# Patient Record
Sex: Male | Born: 1960 | Race: White | Hispanic: No | Marital: Married | Smoking: Former smoker
Health system: Southern US, Community
[De-identification: ages and names within clinical notes are randomized; demographics above are authoritative.]

## PROBLEM LIST (undated history)

## (undated) DIAGNOSIS — I10 Essential (primary) hypertension: Secondary | ICD-10-CM

## (undated) DIAGNOSIS — E785 Hyperlipidemia, unspecified: Secondary | ICD-10-CM

## (undated) DIAGNOSIS — I2510 Atherosclerotic heart disease of native coronary artery without angina pectoris: Secondary | ICD-10-CM

## (undated) HISTORY — PX: CARDIAC CATHETERIZATION: SHX172

## (undated) HISTORY — PX: CORONARY ANGIOPLASTY: SHX604

## (undated) HISTORY — DX: Hyperlipidemia, unspecified: E78.5

## (undated) HISTORY — DX: Atherosclerotic heart disease of native coronary artery without angina pectoris: I25.10

---

## 2003-10-31 ENCOUNTER — Ambulatory Visit (HOSPITAL_COMMUNITY): Admission: RE | Admit: 2003-10-31 | Discharge: 2003-10-31 | Payer: Self-pay | Admitting: Family Medicine

## 2006-09-20 ENCOUNTER — Ambulatory Visit (HOSPITAL_COMMUNITY): Admission: RE | Admit: 2006-09-20 | Discharge: 2006-09-20 | Payer: Self-pay | Admitting: Family Medicine

## 2006-09-28 ENCOUNTER — Encounter: Admission: RE | Admit: 2006-09-28 | Discharge: 2006-09-28 | Payer: Self-pay | Admitting: *Deleted

## 2006-10-21 ENCOUNTER — Ambulatory Visit (HOSPITAL_COMMUNITY): Admission: RE | Admit: 2006-10-21 | Discharge: 2006-10-21 | Payer: Self-pay | Admitting: Gastroenterology

## 2008-04-28 IMAGING — RF DG BE W/ CM - WO/W KUB
11 series · 11 of 11 positions shown · non-contrast
Comparison: none

CLINICAL DATA: Change in bowel habits.  
 BARIUM ENEMA:
 KUB:  Unremarkable.
 A full column study was done with palpation.  There are no filling defects to suggest significant polyps.  No mucosal lesions are identified.  No diverticula are noted.  I was unable to reflux barium into the terminal ileum while filling the colon.  On the post-evacuation images, there is some reflux into the distal small bowel.

[Series 1: run · 1 of 1 slices shown (1 of 11)]
[im 1/1]
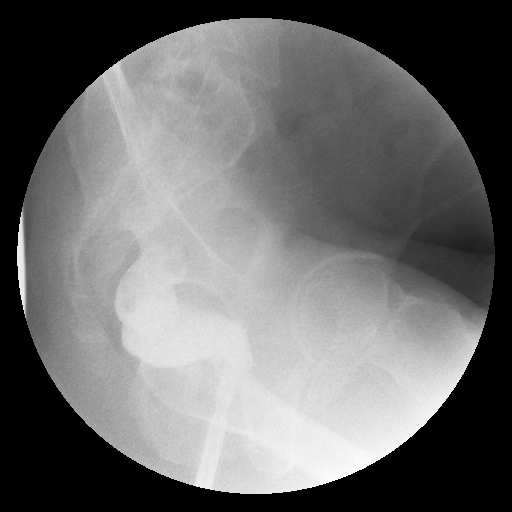

[Series 2: run · 1 of 1 slices shown (2 of 11)]
[im 1/1]
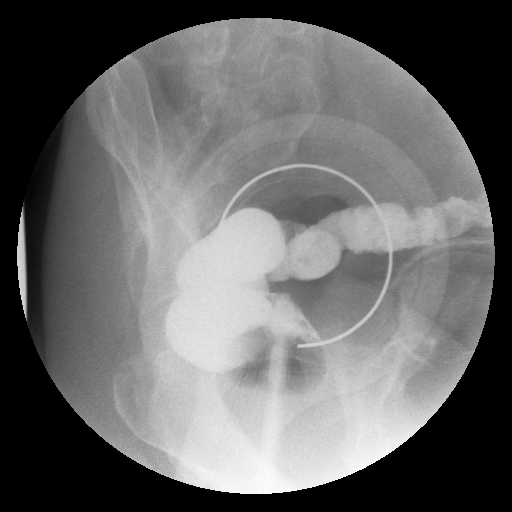

[Series 3: run · 1 of 1 slices shown (3 of 11)]
[im 1/1]
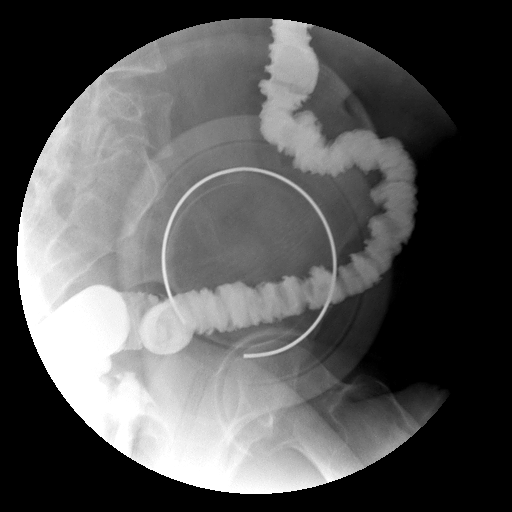

[Series 4: run · 1 of 1 slices shown (4 of 11)]
[im 1/1]
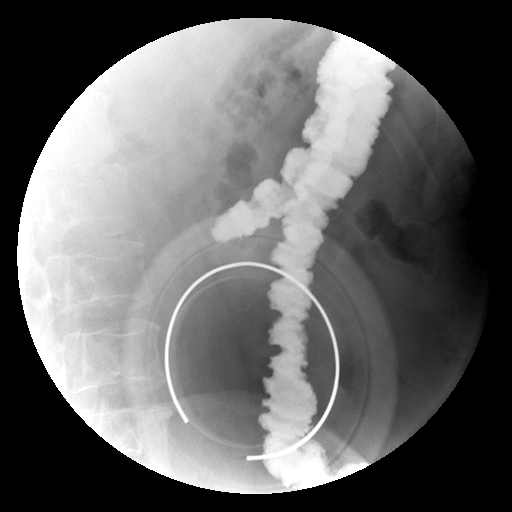

[Series 5: run · 1 of 1 slices shown (5 of 11)]
[im 1/1]
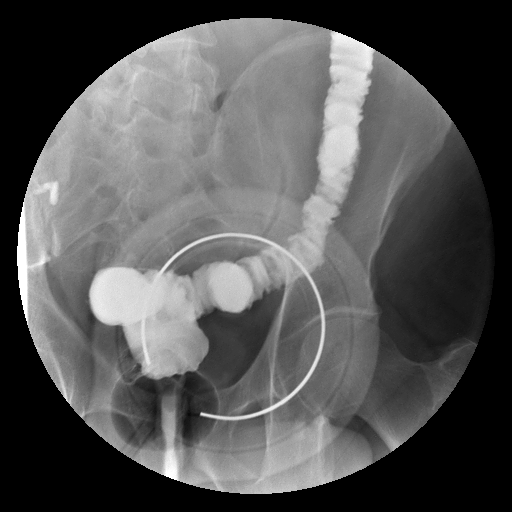

[Series 6: run · 1 of 1 slices shown (6 of 11)]
[im 1/1]
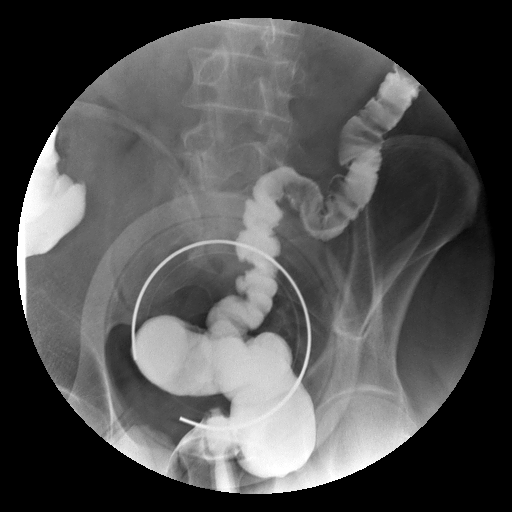

[Series 7: run · 1 of 1 slices shown (7 of 11)]
[im 1/1]
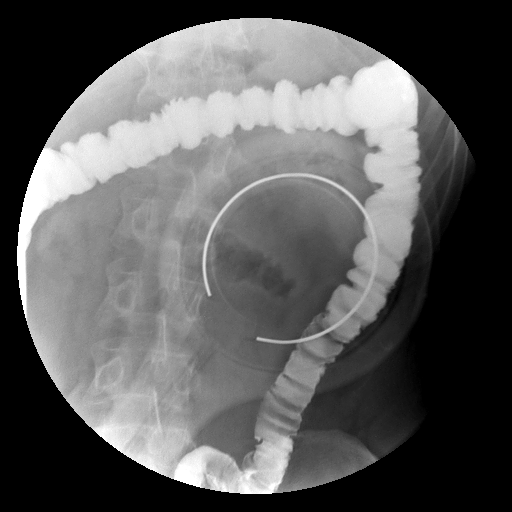

[Series 8: run · 1 of 1 slices shown (8 of 11)]
[im 1/1]
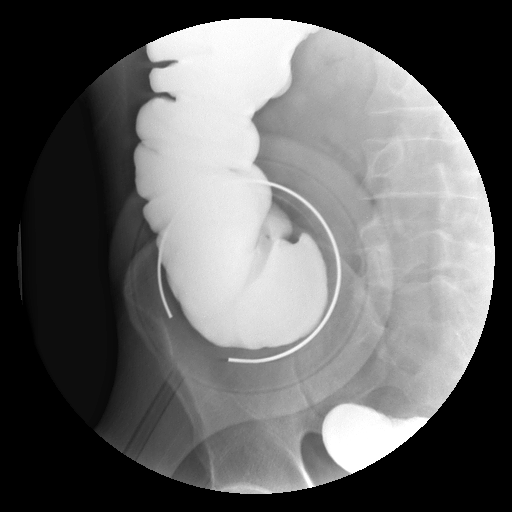

[Series 9: run · 1 of 1 slices shown (9 of 11)]
[im 1/1]
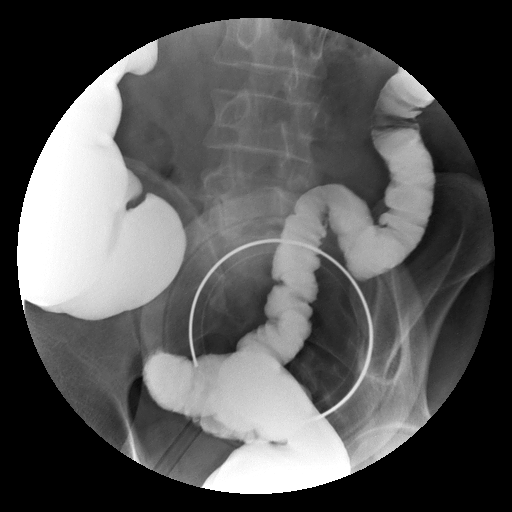

[Series 10: run · 1 of 1 slices shown (10 of 11)]
[im 1/1]
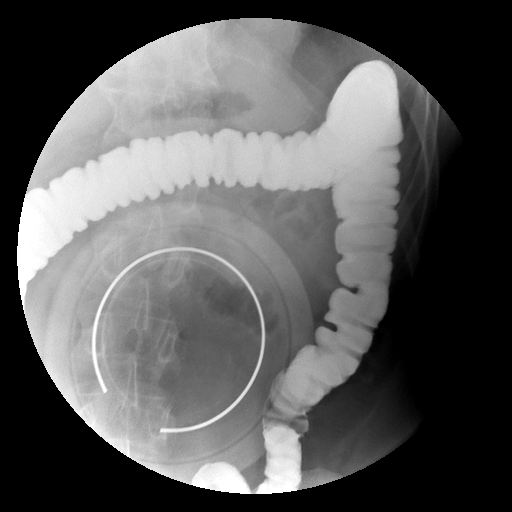

[Series 11: run · 1 of 1 slices shown (11 of 11)]
[im 1/1]
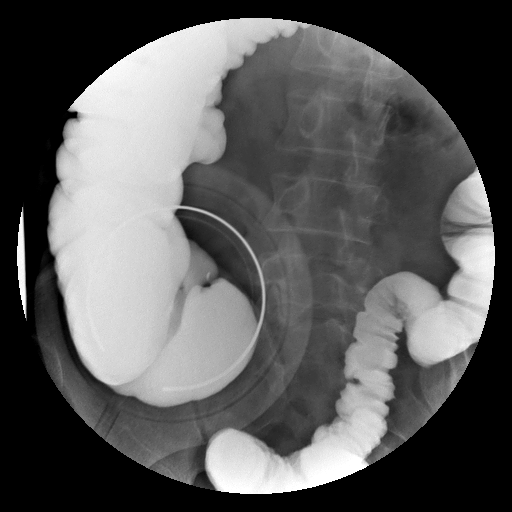

[11 of 11 positions shown; findings below may reference images not displayed]

IMPRESSION: No pathological findings.

## 2014-12-26 ENCOUNTER — Ambulatory Visit (INDEPENDENT_AMBULATORY_CARE_PROVIDER_SITE_OTHER): Payer: Worker's Compensation | Admitting: Family Medicine

## 2014-12-26 ENCOUNTER — Ambulatory Visit: Payer: Worker's Compensation

## 2014-12-26 VITALS — BP 172/90 | HR 75 | Temp 98.6°F | Resp 14 | Ht 70.0 in | Wt 238.6 lb

## 2014-12-26 DIAGNOSIS — S61239A Puncture wound without foreign body of unspecified finger without damage to nail, initial encounter: Secondary | ICD-10-CM | POA: Diagnosis not present

## 2014-12-26 DIAGNOSIS — S6992XA Unspecified injury of left wrist, hand and finger(s), initial encounter: Secondary | ICD-10-CM | POA: Diagnosis not present

## 2014-12-26 DIAGNOSIS — Z23 Encounter for immunization: Secondary | ICD-10-CM

## 2014-12-26 MED ORDER — DOXYCYCLINE HYCLATE 100 MG PO CAPS: 100.0000 mg | ORAL_CAPSULE | Freq: Two times a day (BID) | ORAL | Status: DC

## 2014-12-26 NOTE — Progress Notes (Signed)
    MRN: [id number] DOB: 07-25-1960  Subjective:   Erik Armstrong is a 54 y.o. male presenting for chief complaint of Hand Pain  Reports nail gun injury sustained to his left thumb while at work today, 12/26/2014. Reports nail was ~3" in length and per patient did not penetrate to opposite side, per patient now went in about a quarter inch and he pulled it out himself. Patient felt immediate pain and had subsequent bleeding and swelling. Has not tried any medications for relief. Denies decreased range of motion, decreased sensation, active bleeding, drainage, bony deformity. Also denies headache, double vision, dizziness, chest pain, shortness of breath, heart racing, palpitations, jaw pain, neck pain, left arm pain, nausea, vomiting, abdominal pain. Denies any other aggravating or relieving factors, no other questions or concerns.  Erik Armstrong currently has no medications in their medication list. He has No Known Allergies.  ROS As in subjective.  Objective:   Vitals: BP 172/90 mmHg  Pulse 75  Temp(Src) 98.6 F (37 C) (Oral)  Resp 14  Ht 5\' 10"  (1.778 m)  Wt 238 lb 9.6 oz (108.228 kg)  BMI 34.24 kg/m2  SpO2 98%  Physical Exam  Constitutional: He is oriented to person, place, and time. He appears well-developed and well-nourished.  Cardiovascular: Normal rate, regular rhythm and intact distal pulses.  Exam reveals no gallop and no friction rub.   No murmur heard. Pulmonary/Chest: No respiratory distress. He has no wheezes. He has no rales.  Musculoskeletal:       Left hand: He exhibits tenderness (near wound) and swelling. He exhibits normal range of motion, no bony tenderness, normal capillary refill, no deformity and no laceration. Normal sensation noted. Normal strength noted.       Hands: Neurological: He is alert and oriented to person, place, and time.  Skin: Skin is warm and dry. No rash noted. No erythema. No pallor.   [facility] reading (PRIMARY) by  Dr. Katrinka Blazing and PA-Keelie Zemanek. Left  thumb: possible puncture wound to base of proximal phalanx seen on all 3 views, please comment.  Assessment and Plan :   1. Thumb injury, left, initial encounter 2. Need for Tdap vaccination 3. Puncture wound of finger, initial encounter - Stable, clean wound and irrigated with 30 cc of sterile saline, patient is to return to clinic for recheck in 5 days. In the meantime, start doxycycline for 10 days and use ibuprofen or Tylenol for pain and inflammation. Were restrictions provided. - Counseled patient on worsening signs and symptoms of infection, instructions provided in AVS before 5 days if these develop. Patient agreed.  Wallis Bamberg, PA-C [facility] [phone] 12/26/2014 1:25 PM

## 2014-12-26 NOTE — Patient Instructions (Signed)
Puncture Wound °A puncture wound is an injury that extends through all layers of the skin and into the tissue beneath the skin (subcutaneous tissue). Puncture wounds become infected easily because germs often enter the body and go beneath the skin during the injury. Having a deep wound with a small entrance point makes it difficult for your caregiver to adequately clean the wound. This is especially true if you have stepped on a nail and it has passed through a dirty shoe or other situations where the wound is obviously contaminated. °CAUSES  °Many puncture wounds involve glass, nails, splinters, fish hooks, or other objects that enter the skin (foreign bodies). A puncture wound may also be caused by a human bite or animal bite. °DIAGNOSIS  °A puncture wound is usually diagnosed by your history and a physical exam. You may need to have an X-ray or an ultrasound to check for any foreign bodies still in the wound. °TREATMENT  °· Your caregiver will clean the wound as thoroughly as possible. Depending on the location of the wound, a bandage (dressing) may be applied. °· Your caregiver might prescribe antibiotic medicines. °· You may need a follow-up visit to check on your wound. Follow all instructions as directed by your caregiver. °HOME CARE INSTRUCTIONS  °· Change your dressing once per day, or as directed by your caregiver. If the dressing sticks, it may be removed by soaking the area in water. °· If your caregiver has given you follow-up instructions, it is very important that you return for a follow-up appointment. Not following up as directed could result in a chronic or permanent injury, pain, and disability. °· Only take over-the-counter or prescription medicines for pain, discomfort, or fever as directed by your caregiver. °· If you are given antibiotics, take them as directed. Finish them even if you start to feel better. °You may need a tetanus shot if: °· You cannot remember when you had your last tetanus  shot. °· You have never had a tetanus shot. °If you got a tetanus shot, your arm may swell, get red, and feel warm to the touch. This is common and not a problem. If you need a tetanus shot and you choose not to have one, there is a rare chance of getting tetanus. Sickness from tetanus can be serious. °You may need a rabies shot if an animal bite caused your puncture wound. °SEEK MEDICAL CARE IF:  °· You have redness, swelling, or increasing pain in the wound. °· You have red streaks going away from the wound. °· You notice a bad smell coming from the wound or dressing. °· You have yellowish-white fluid (pus) coming from the wound. °· You are treated with an antibiotic for infection, but the infection is not getting better. °· You notice something in the wound, such as rubber from your shoe, cloth, or another object. °· You have a fever. °· You have severe pain. °· You have difficulty breathing. °· You feel dizzy or faint. °· You cannot stop vomiting. °· You lose feeling, develop numbness, or cannot move a limb below the wound. °· Your symptoms worsen. °MAKE SURE YOU: °· Understand these instructions. °· Will watch your condition. °· Will get help right away if you are not doing well or get worse. °Document Released: 04/08/2005 Document Revised: 09/21/2011 Document Reviewed: 12/16/2010 °ExitCare® Patient Information ©2015 [employer]. This information is not intended to replace advice given to you by your health care provider. Make sure you discuss any questions you   have with your health care provider. ° °

## 2014-12-27 NOTE — Progress Notes (Signed)
History and physical examinations reviewed in detail with PA [facility].  Xray reviewed during visit.  Agree with assessment and plan. Guerin Lashomb Paulita Fujita, M.D. [facility] [address] [place], [place]  [address] [phone] phone [phone] fax

## 2014-12-31 ENCOUNTER — Ambulatory Visit (INDEPENDENT_AMBULATORY_CARE_PROVIDER_SITE_OTHER): Payer: Worker's Compensation | Admitting: Internal Medicine

## 2014-12-31 VITALS — BP 180/100 | HR 99 | Temp 98.1°F | Resp 16 | Ht 70.0 in | Wt 236.0 lb

## 2014-12-31 DIAGNOSIS — S61239D Puncture wound without foreign body of unspecified finger without damage to nail, subsequent encounter: Secondary | ICD-10-CM | POA: Diagnosis not present

## 2014-12-31 DIAGNOSIS — S6992XA Unspecified injury of left wrist, hand and finger(s), initial encounter: Secondary | ICD-10-CM

## 2014-12-31 NOTE — Progress Notes (Signed)
    MRN: [id number] DOB: 09-18-1960  Subjective:   Erik Armstrong is a 54 y.o. male presenting for follow up on workers comp injury. Patient sustained no puncture wound to his left on 12/26/2014. He was seen here at [facility] and started on doxycycline. Today, he reports residual mild and intermittent tenderness over his thumb. The swelling has improved. Denies fevers, decreased range of motion, decreased strength, numbness, tingling, fevers, redness, drainage of pus or bleeding. Denies any other aggravating or relieving factors, no other questions or concerns.  Erik Armstrong has a current medication list which includes the following prescription(s): doxycycline. He has No Known Allergies.  ROS As in subjective.  Objective:   Vitals: BP 180/100 mmHg  Pulse 99  Temp(Src) 98.1 F (36.7 C) (Oral)  Resp 16  Ht 5\' 10"  (1.778 m)  Wt 236 lb (107.049 kg)  BMI 33.86 kg/m2  SpO2 99%  Physical Exam  Constitutional: He is oriented to person, place, and time. He appears well-developed and well-nourished.  Cardiovascular: Normal rate.   Pulmonary/Chest: Effort normal.  Musculoskeletal:       Left hand: He exhibits swelling (trace edema). He exhibits normal range of motion, no tenderness, no bony tenderness, normal capillary refill, no deformity and no laceration. Normal sensation noted. Normal strength (5/5) noted.  Neurological: He is alert and oriented to person, place, and time.  Skin: Skin is warm and dry. No rash noted. No erythema. No pallor.   Assessment and Plan :   1. Thumb injury, left, initial encounter 2. Puncture wound of finger, subsequent encounter - Stable, healing well. Anticipatory guidance provided, recommended she use ibuprofen or naproxen for pain and inflammation. Also recommended that he ice his hand after work. Patient should followup only if he does not achieve complete recovery in 1 to 2 weeks.  Wallis Bamberg, PA-C [facility] [phone] 12/31/2014 8:17 AM

## 2014-12-31 NOTE — Patient Instructions (Signed)
You may use 400-600 mg of ibuprofen with food every 6-8 hours. Please ice your hand when you come home from work, 10 minutes every 2 hours.  Puncture Wound A puncture wound is an injury that extends through all layers of the skin and into the tissue beneath the skin (subcutaneous tissue). Puncture wounds become infected easily because germs often enter the body and go beneath the skin during the injury. Having a deep wound with a small entrance point makes it difficult for your caregiver to adequately clean the wound. This is especially true if you have stepped on a nail and it has passed through a dirty shoe or other situations where the wound is obviously contaminated. CAUSES  Many puncture wounds involve glass, nails, splinters, fish hooks, or other objects that enter the skin (foreign bodies). A puncture wound may also be caused by a human bite or animal bite. DIAGNOSIS  A puncture wound is usually diagnosed by your history and a physical exam. You may need to have an X-ray or an ultrasound to check for any foreign bodies still in the wound. TREATMENT   Your caregiver will clean the wound as thoroughly as possible. Depending on the location of the wound, a bandage (dressing) may be applied.  Your caregiver might prescribe antibiotic medicines.  You may need a follow-up visit to check on your wound. Follow all instructions as directed by your caregiver. HOME CARE INSTRUCTIONS   Change your dressing once per day, or as directed by your caregiver. If the dressing sticks, it may be removed by soaking the area in water.  If your caregiver has given you follow-up instructions, it is very important that you return for a follow-up appointment. Not following up as directed could result in a chronic or permanent injury, pain, and disability.  Only take over-the-counter or prescription medicines for pain, discomfort, or fever as directed by your caregiver.  If you are given antibiotics, take them as  directed. Finish them even if you start to feel better. You may need a tetanus shot if:  You cannot remember when you had your last tetanus shot.  You have never had a tetanus shot. If you got a tetanus shot, your arm may swell, get red, and feel warm to the touch. This is common and not a problem. If you need a tetanus shot and you choose not to have one, there is a rare chance of getting tetanus. Sickness from tetanus can be serious. You may need a rabies shot if an animal bite caused your puncture wound. SEEK MEDICAL CARE IF:   You have redness, swelling, or increasing pain in the wound.  You have red streaks going away from the wound.  You notice a bad smell coming from the wound or dressing.  You have yellowish-white fluid (pus) coming from the wound.  You are treated with an antibiotic for infection, but the infection is not getting better.  You notice something in the wound, such as rubber from your shoe, cloth, or another object.  You have a fever.  You have severe pain.  You have difficulty breathing.  You feel dizzy or faint.  You cannot stop vomiting.  You lose feeling, develop numbness, or cannot move a limb below the wound.  Your symptoms worsen. MAKE SURE YOU:  Understand these instructions.  Will watch your condition.  Will get help right away if you are not doing well or get worse. Document Released: 04/08/2005 Document Revised: 09/21/2011 Document Reviewed: 12/16/2010 ExitCare  Patient Information 2015 [employer]. This information is not intended to replace advice given to you by your health care provider. Make sure you discuss any questions you have with your health care provider.  

## 2016-07-03 IMAGING — CR DG FINGER THUMB 2+V*L*
1 series · 1 of 1 positions shown · non-contrast
Comparison: None.

CLINICAL DATA: Thumb injury using nail gun, puncture wound at the
base of proximal phalanx, initial encounter

EXAM:
LEFT THUMB 2+V

[PA]
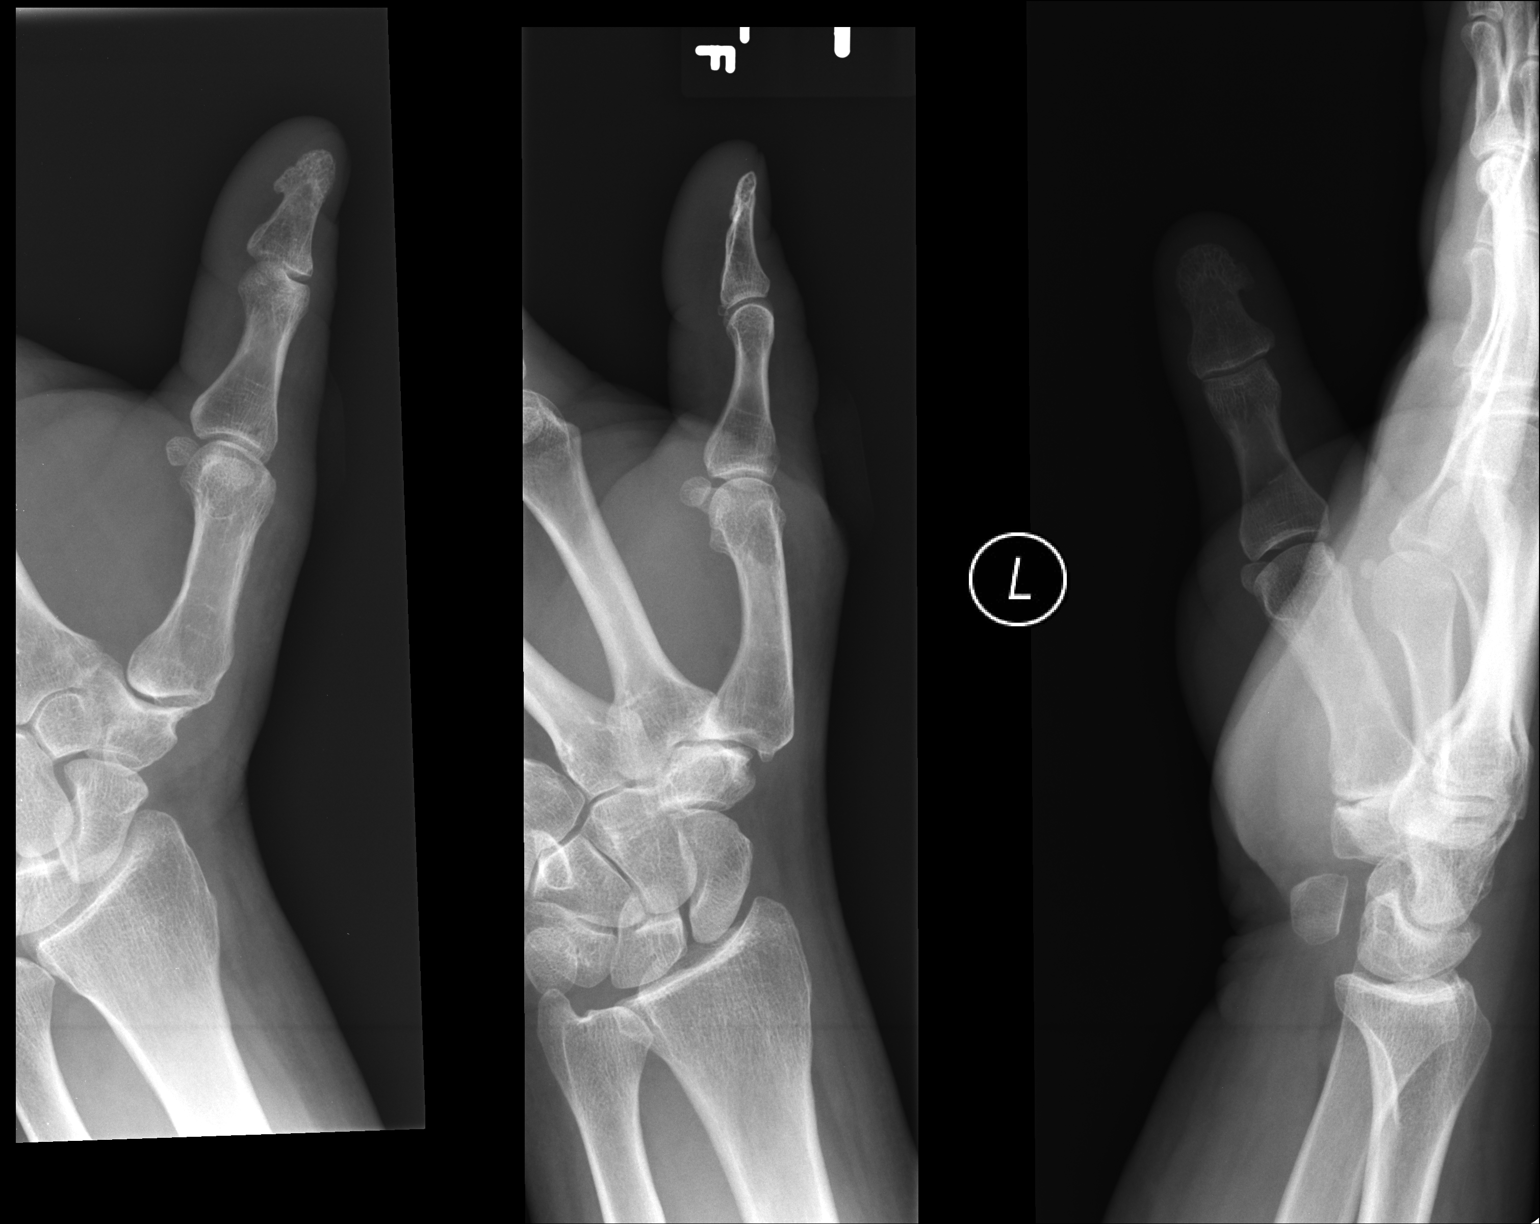

[1 of 1 positions shown; findings below may reference images not displayed]

FINDINGS: Three views of the left thumb submitted. No displaced fracture or
subluxation. There is focal cortical irregularity dorsal aspect at
the base of proximal phalanx. Although no avulsed bony fragment is
noted focal bone contusion or bruising cannot be excluded given the
mechanism of injury. Clinical correlation is necessary. Further
correlation with MRI could be performed as clinically warranted.
IMPRESSION: No displaced fracture or subluxation. There is focal cortical
irregularity dorsal aspect at the base of proximal phalanx. Although
no avulsed bony fragment is noted focal bone contusion or bruising
cannot be excluded given the mechanism of injury. Clinical
correlation is necessary. Further correlation with MRI could be
performed as clinically warranted.

## 2017-07-30 ENCOUNTER — Emergency Department (HOSPITAL_COMMUNITY): Payer: Worker's Compensation

## 2017-07-30 ENCOUNTER — Encounter (HOSPITAL_COMMUNITY): Payer: Self-pay

## 2017-07-30 ENCOUNTER — Other Ambulatory Visit: Payer: Self-pay

## 2017-07-30 ENCOUNTER — Emergency Department (HOSPITAL_COMMUNITY)
Admission: EM | Admit: 2017-07-30 | Discharge: 2017-07-31 | Disposition: A | Discharge status: Home / Self Care | Payer: Worker's Compensation | Attending: Emergency Medicine | Admitting: Emergency Medicine

## 2017-07-30 DIAGNOSIS — F17200 Nicotine dependence, unspecified, uncomplicated: Secondary | ICD-10-CM | POA: Diagnosis not present

## 2017-07-30 DIAGNOSIS — N5082 Scrotal pain: Secondary | ICD-10-CM | POA: Diagnosis present

## 2017-07-30 DIAGNOSIS — N451 Epididymitis: Secondary | ICD-10-CM | POA: Insufficient documentation

## 2017-07-30 DIAGNOSIS — N5089 Other specified disorders of the male genital organs: Secondary | ICD-10-CM

## 2017-07-30 DIAGNOSIS — Z79899 Other long term (current) drug therapy: Secondary | ICD-10-CM | POA: Diagnosis not present

## 2017-07-30 DIAGNOSIS — I10 Essential (primary) hypertension: Secondary | ICD-10-CM | POA: Diagnosis not present

## 2017-07-30 HISTORY — DX: Essential (primary) hypertension: I10

## 2017-07-30 LAB — URINALYSIS, ROUTINE W REFLEX MICROSCOPIC
BILIRUBIN URINE: NEGATIVE
Bacteria, UA: NONE SEEN
Glucose, UA: NEGATIVE mg/dL
Ketones, ur: NEGATIVE mg/dL
LEUKOCYTES UA: NEGATIVE
NITRITE: NEGATIVE
Protein, ur: NEGATIVE mg/dL
SPECIFIC GRAVITY, URINE: 1.016 (ref 1.005–1.030)
pH: 5 (ref 5.0–8.0)

## 2017-07-30 NOTE — ED Provider Notes (Signed)
[facility] EMERGENCY DEPARTMENT Provider Note   CSN: [id number] Arrival date & time: 07/30/17  1910     History   Chief Complaint Chief Complaint  Patient presents with  . Groin Swelling    HPI Erik Armstrong is a 57 y.o. male who presents for evaluation of 1 week of left scrotal pain and swelling.  Patient reports his symptoms have worsened over the last week.  He was seen by urgent care and was told to go to the emergency department for further evaluation to rule out hernia.  Patient reports that over the course of the week, the left testicle has swollen significantly.  He reports that the left testicle is painful to touch.  He denies any warmth or redness.  Patient denies any fevers, night sweats, weight loss, difficulty urinating, dysuria, hematuria, penile pain, penile discharge.  The history is provided by the patient.    Past Medical History:  Diagnosis Date  . Hypertension     There are no active problems to display for this patient.   History reviewed. No pertinent surgical history.     Home Medications    Prior to Admission medications   Medication Sig Start Date End Date Taking? Authorizing Provider  fluticasone (FLONASE) 50 MCG/ACT nasal spray Place 2 sprays into both nostrils daily as needed for allergies or rhinitis.   Yes [provider]  loratadine (CLARITIN) 10 MG tablet Take 10 mg by mouth daily as needed for allergies or rhinitis.   Yes [provider]  naproxen sodium (ALEVE) 220 MG tablet Take 220 mg by mouth every morning.   Yes [provider]  ciprofloxacin (CIPRO) 500 MG tablet Take 1 tablet (500 mg total) by mouth every 12 (twelve) hours. 07/31/17   Maxwell Caul, PA-C  doxycycline (VIBRAMYCIN) 100 MG capsule Take 1 capsule (100 mg total) by mouth 2 (two) times daily. Patient not taking: Reported on 07/30/2017 12/26/14   Wallis Bamberg, PA-C  ibuprofen (ADVIL,MOTRIN) 600 MG tablet Take 1 tablet (600 mg  total) by mouth every 6 (six) hours as needed. 07/31/17   Maxwell Caul, PA-C    Family History History reviewed. No pertinent family history.  Social History Social History   Tobacco Use  . Smoking status: Current Every Day Smoker  Substance Use Topics  . Alcohol use: No    Alcohol/week: 0.0 oz    Frequency: Never  . Drug use: No     Allergies   Patient has no known allergies.   Review of Systems Review of Systems  Constitutional: Negative for diaphoresis, fever and unexpected weight change.  Cardiovascular: Negative for chest pain.  Gastrointestinal: Negative for abdominal pain, nausea and vomiting.  Genitourinary: Positive for scrotal swelling and testicular pain. Negative for discharge, dysuria, hematuria, penile pain and penile swelling.     Physical Exam Updated Vital Signs BP (!) 144/85 (BP Location: Right Arm)   Pulse 93   Temp 98 F (36.7 C) (Oral)   Resp 17   SpO2 98%   Physical Exam  Constitutional: He appears well-developed and well-nourished.  HENT:  Head: Normocephalic and atraumatic.  Eyes: Conjunctivae and EOM are normal. Right eye exhibits no discharge. Left eye exhibits no discharge. No scleral icterus.  Pulmonary/Chest: Effort normal.  Abdominal: Hernia confirmed negative in the right inguinal area.  Genitourinary: Penis normal. Right testis shows no swelling and no tenderness. Left testis shows swelling and tenderness. Circumcised.  Genitourinary Comments: The exam was performed with a  chaperone present. Normal male genitalia. No evidence of rash, ulcers or lesions.  There is noted swelling and tenderness of the left testicle/scrotum.  No overlying warmth or erythema.  Questionable hernia or mass noted on the left side.  There is a small area that is palpable but is not able to be reduced.  Neurological: He is alert.  Skin: Skin is warm and dry.  Psychiatric: He has a normal mood and affect. His speech is normal and behavior is normal.    Nursing note and vitals reviewed.    ED Treatments / Results  Labs (all labs ordered are listed, but only abnormal results are displayed) Labs Reviewed  URINALYSIS, ROUTINE W REFLEX MICROSCOPIC - Abnormal; Notable for the following components:      Result Value   Hgb urine dipstick SMALL (*)    Squamous Epithelial / LPF 0-5 (*)    All other components within normal limits  URINE CULTURE    EKG  EKG Interpretation None       Radiology [place] Scrotum  Result Date: 07/30/2017 CLINICAL DATA:  Groin pain EXAM: SCROTAL ULTRASOUND DOPPLER ULTRASOUND OF THE TESTICLES TECHNIQUE: Complete ultrasound examination of the testicles, epididymis, and other scrotal structures was performed. Color and spectral Doppler ultrasound were also utilized to evaluate blood flow to the testicles. COMPARISON:  None. FINDINGS: Right testicle Measurements: 4.8 x 2.4 x 3.7 cm. No mass or microlithiasis visualized. Left testicle Measurements: 4.2 x 2.4 x 3.3 cm. Hypoechoic masslike area measuring 2 x 1.9 x 2.1 cm without significant internal vascularity. Right epididymis:  Multiple cysts. Left epididymis: Slightly echogenic and enlarged. Mild increased vascularity. Hydrocele:  Small left hydrocele containing particulate foci Varicocele:  Small left varicocele Pulsed Doppler interrogation of both testes demonstrates normal low resistance arterial and venous waveforms bilaterally. IMPRESSION: 1. Hypoechoic masslike area in the left testis measuring 2.1 cm without significant internal flow, differential includes segmental testicular infarct and hypovascular solid testicular mass. 2. Slightly echogenic and vascular left epididymis, query epididymitis 3. Small left hydrocele 4. Small left varicocele Electronically Signed   By: Jasmine PangKim  Fujinaga M.D.   On: 07/30/2017 23:51   [place] Art/ven Flow Abd Pelv Doppler  Result Date: 07/30/2017 CLINICAL DATA:  Groin pain EXAM: SCROTAL ULTRASOUND DOPPLER ULTRASOUND OF THE TESTICLES TECHNIQUE:  Complete ultrasound examination of the testicles, epididymis, and other scrotal structures was performed. Color and spectral Doppler ultrasound were also utilized to evaluate blood flow to the testicles. COMPARISON:  None. FINDINGS: Right testicle Measurements: 4.8 x 2.4 x 3.7 cm. No mass or microlithiasis visualized. Left testicle Measurements: 4.2 x 2.4 x 3.3 cm. Hypoechoic masslike area measuring 2 x 1.9 x 2.1 cm without significant internal vascularity. Right epididymis:  Multiple cysts. Left epididymis: Slightly echogenic and enlarged. Mild increased vascularity. Hydrocele:  Small left hydrocele containing particulate foci Varicocele:  Small left varicocele Pulsed Doppler interrogation of both testes demonstrates normal low resistance arterial and venous waveforms bilaterally. IMPRESSION: 1. Hypoechoic masslike area in the left testis measuring 2.1 cm without significant internal flow, differential includes segmental testicular infarct and hypovascular solid testicular mass. 2. Slightly echogenic and vascular left epididymis, query epididymitis 3. Small left hydrocele 4. Small left varicocele Electronically Signed   By: Jasmine PangKim  Fujinaga M.D.   On: 07/30/2017 23:51    Procedures Procedures (including critical care time)  Medications Ordered in ED Medications  ciprofloxacin (CIPRO) tablet 500 mg (not administered)     Initial Impression / Assessment and Plan / ED Course  I have reviewed  the triage vital signs and the nursing notes.  Pertinent labs & imaging results that were available during my care of the patient were reviewed by me and considered in my medical decision making (see chart for details).     57 y.o. M with 1 week of left testilcular pain and swelling.  No preceding trauma, injury, fall.  No urinary complaints.  No fever.  Went to urgent care and was sent here for further evaluation. Patient is afebrile , non-toxic appearing, sitting comfortably on examination table. Vital signs  reviewed and stable.  On GU exam, there is swelling/tenderness of the left testicle/scrotum.  No overlying warmth or erythema.  There is a small area that I can palpate but I cannot reduce.  Questionable hernia versus mass.  Will obtain UA and ultrasound of scrotum.  Urine culture sent.  UA is negative for any acute signs of infection.  Ultrasound shows a hypoechoic masslike area in the left testes without significant internal flow.  They mentioned that the differential for this includes segmental testicular infarct and hypovascular solid testicular mass.  They also mention that there is a slightly echogenic area that may be beginnings of epididymitis.  Given findings, will plan to consult urology.  Discussed patient with Dr. Ilene Qua (Urology) regarding findings on ultrasound.  Heme recommends treating this is epididymitis.  He recommends obtaining a urine culture.  Recommend starting patient on Cipro or Bactrim.  Patient can take ibuprofen or Mobic for anti-inflammatory effect.  He will plan to follow up with patient in the office next week.  Discussed plan with patient.  They are agreeable to plan. Patient with no known drug allergies. Patient stable for discharge at this time. Patient had ample opportunity for questions and discussion. All patient's questions were answered with full understanding. Strict return precautions discussed. Patient expresses understanding and agreement to plan.   Final Clinical Impressions(s) / ED Diagnoses   Final diagnoses:  Epididymitis  Testicular swelling    ED Discharge Orders        Ordered    ciprofloxacin (CIPRO) 500 MG tablet  Every 12 hours     07/31/17 0038    ibuprofen (ADVIL,MOTRIN) 600 MG tablet  Every 6 hours PRN     07/31/17 0038       Maxwell Caul, PA-C 07/31/17 1610    Eber Hong, MD 07/31/17 1450

## 2017-07-30 NOTE — ED Triage Notes (Signed)
Pt states he started having pain in groin area for about 2 days; pt states he noticed swelling to left groin area today; Pt denies discharge or pain on urination; Pt states he went to urgent care who states he may have possible hernia; pt c/o of pain at 5/10 while sitting but moving increases pain.-Monique,RN

## 2017-07-31 MED ORDER — IBUPROFEN 600 MG PO TABS: 600.0000 mg | ORAL_TABLET | Freq: Four times a day (QID) | ORAL | 0 refills | Status: DC | PRN

## 2017-07-31 MED ORDER — CIPROFLOXACIN HCL 500 MG PO TABS: 500.0000 mg | ORAL_TABLET | Freq: Two times a day (BID) | ORAL | 0 refills | Status: DC

## 2017-07-31 MED ORDER — CIPROFLOXACIN HCL 500 MG PO TABS
500.0000 mg | ORAL_TABLET | Freq: Once | ORAL | Status: AC
  Administered 2017-07-31: 500 mg via ORAL
  Filled 2017-07-31: qty 1

## 2017-07-31 NOTE — Discharge Instructions (Signed)
As we discussed, there appears to be the beginning of infection on your ultrasound.  There is also a small abnormality that we cannot conclusively determine what it is.  This will need further evaluation by the urologist provided in the referrals.  You need to call their office and arrange for an appointment in the next 3-4 days.  Take antibiotics as directed. Please take all of your antibiotics until finished.  Take ibuprofen as directed for pain.  Return to the emergency department for any fever, worsening pain, swelling, worsening redness of the testicle, difficulty with urination or any other worsening or concerning symptoms.

## 2017-08-01 LAB — URINE CULTURE

## 2017-08-17 ENCOUNTER — Other Ambulatory Visit: Payer: Self-pay | Admitting: Urology

## 2017-08-17 DIAGNOSIS — N509 Disorder of male genital organs, unspecified: Secondary | ICD-10-CM

## 2017-08-30 ENCOUNTER — Ambulatory Visit
Admission: RE | Admit: 2017-08-30 | Discharge: 2017-08-30 | Disposition: A | Discharge status: Home / Self Care | Payer: 59 | Source: Ambulatory Visit | Attending: Urology | Admitting: Urology

## 2017-08-30 DIAGNOSIS — N509 Disorder of male genital organs, unspecified: Secondary | ICD-10-CM

## 2017-08-30 MED ORDER — GADOBENATE DIMEGLUMINE 529 MG/ML IV SOLN
20.0000 mL | Freq: Once | INTRAVENOUS | Status: AC | PRN
  Administered 2017-08-30: 20 mL via INTRAVENOUS

## 2020-05-18 ENCOUNTER — Inpatient Hospital Stay (HOSPITAL_COMMUNITY)
Admission: EM | Admit: 2020-05-18 | Discharge: 2020-05-21 | DRG: 247 | Disposition: A | Discharge status: Home / Self Care | Payer: Managed Care, Other (non HMO) | Attending: Cardiovascular Disease | Admitting: Cardiovascular Disease

## 2020-05-18 ENCOUNTER — Emergency Department (HOSPITAL_COMMUNITY): Payer: Managed Care, Other (non HMO)

## 2020-05-18 ENCOUNTER — Encounter (HOSPITAL_COMMUNITY): Payer: Self-pay

## 2020-05-18 ENCOUNTER — Other Ambulatory Visit: Payer: Self-pay

## 2020-05-18 DIAGNOSIS — I160 Hypertensive urgency: Secondary | ICD-10-CM | POA: Diagnosis present

## 2020-05-18 DIAGNOSIS — I351 Nonrheumatic aortic (valve) insufficiency: Secondary | ICD-10-CM | POA: Diagnosis present

## 2020-05-18 DIAGNOSIS — F17200 Nicotine dependence, unspecified, uncomplicated: Secondary | ICD-10-CM | POA: Diagnosis present

## 2020-05-18 DIAGNOSIS — E785 Hyperlipidemia, unspecified: Secondary | ICD-10-CM | POA: Diagnosis present

## 2020-05-18 DIAGNOSIS — Z720 Tobacco use: Secondary | ICD-10-CM

## 2020-05-18 DIAGNOSIS — I214 Non-ST elevation (NSTEMI) myocardial infarction: Principal | ICD-10-CM | POA: Diagnosis present

## 2020-05-18 DIAGNOSIS — I249 Acute ischemic heart disease, unspecified: Secondary | ICD-10-CM

## 2020-05-18 DIAGNOSIS — Z791 Long term (current) use of non-steroidal anti-inflammatories (NSAID): Secondary | ICD-10-CM

## 2020-05-18 DIAGNOSIS — I10 Essential (primary) hypertension: Secondary | ICD-10-CM | POA: Diagnosis present

## 2020-05-18 DIAGNOSIS — I358 Other nonrheumatic aortic valve disorders: Secondary | ICD-10-CM | POA: Diagnosis present

## 2020-05-18 DIAGNOSIS — D72829 Elevated white blood cell count, unspecified: Secondary | ICD-10-CM | POA: Diagnosis present

## 2020-05-18 DIAGNOSIS — Z20822 Contact with and (suspected) exposure to covid-19: Secondary | ICD-10-CM | POA: Diagnosis present

## 2020-05-18 DIAGNOSIS — I2510 Atherosclerotic heart disease of native coronary artery without angina pectoris: Secondary | ICD-10-CM | POA: Diagnosis present

## 2020-05-18 DIAGNOSIS — R072 Precordial pain: Secondary | ICD-10-CM | POA: Diagnosis not present

## 2020-05-18 DIAGNOSIS — Z8249 Family history of ischemic heart disease and other diseases of the circulatory system: Secondary | ICD-10-CM

## 2020-05-18 DIAGNOSIS — R011 Cardiac murmur, unspecified: Secondary | ICD-10-CM | POA: Diagnosis present

## 2020-05-18 LAB — CBC
HCT: 45 % (ref 39.0–52.0)
Hemoglobin: 14.7 g/dL (ref 13.0–17.0)
MCH: 30.4 pg (ref 26.0–34.0)
MCHC: 32.7 g/dL (ref 30.0–36.0)
MCV: 93.2 fL (ref 80.0–100.0)
Platelets: 249 10*3/uL (ref 150–400)
RBC: 4.83 MIL/uL (ref 4.22–5.81)
RDW: 13.3 % (ref 11.5–15.5)
WBC: 13.1 10*3/uL — ABNORMAL HIGH (ref 4.0–10.5)
nRBC: 0 % (ref 0.0–0.2)

## 2020-05-18 LAB — BASIC METABOLIC PANEL
Anion gap: 13 (ref 5–15)
BUN: 17 mg/dL (ref 8–23)
CO2: 20 mmol/L — ABNORMAL LOW (ref 22–32)
Calcium: 8.8 mg/dL — ABNORMAL LOW (ref 8.9–10.3)
Chloride: 105 mmol/L (ref 98–111)
Creatinine, Ser: 1.05 mg/dL (ref 0.61–1.24)
GFR, Estimated: >60 mL/min (ref >60)
Glucose, Bld: 94 mg/dL (ref 70–99)
Potassium: 4.5 mmol/L (ref 3.5–5.1)
Sodium: 138 mmol/L (ref 135–145)

## 2020-05-18 LAB — TROPONIN I (HIGH SENSITIVITY): Troponin I (High Sensitivity): 384 ng/L (ref <18)

## 2020-05-18 MED ORDER — HEPARIN (PORCINE) 25000 UT/250ML-% IV SOLN
2100.0000 [IU]/h | INTRAVENOUS | Status: DC
  Administered 2020-05-19 (×2): 1350 [IU]/h via INTRAVENOUS
  Administered 2020-05-19: 1650 [IU]/h via INTRAVENOUS
  Filled 2020-05-18 (×3): qty 250

## 2020-05-18 MED ORDER — FENTANYL CITRATE (PF) 100 MCG/2ML IJ SOLN
50.0000 ug | Freq: Once | INTRAMUSCULAR | Status: AC
  Administered 2020-05-19: 50 ug via INTRAVENOUS
  Filled 2020-05-18: qty 2

## 2020-05-18 MED ORDER — HEPARIN BOLUS VIA INFUSION
4000.0000 [IU] | Freq: Once | INTRAVENOUS | Status: AC
  Administered 2020-05-19: 4000 [IU] via INTRAVENOUS
  Filled 2020-05-18: qty 4000

## 2020-05-18 NOTE — ED Notes (Signed)
Pt is in radiology at this time.  

## 2020-05-18 NOTE — ED Triage Notes (Signed)
EMS reports pt is from home. Central chest pain intermittent throughout day. Rates it 8/10 when present, non radiating.  History of untreated hypertension.

## 2020-05-18 NOTE — ED Provider Notes (Signed)
[facility] EMERGENCY DEPARTMENT Provider Note   CSN: [id number] Arrival date & time: 05/18/20  2220     History Chief Complaint  Patient presents with  . Chest Pain    Erik Armstrong is a 59 y.o. male. Presents here with concern for chest pain. Reports noted pain this morning when he woke up, lasted for approximately 1 hour, moderate in severity, pressure, discomfort sensation, central, nonradiating. No associated back pain, arm pain, jaw pain. Seem to resolve spontaneously. Did not have pain throughout the day however this evening patient started to have pain again, this time more severe, 8 out of 10, similarly central pressure, nonradiating. Pain improved after taking aspirin and nitroglycerin.     HPI     Past Medical History:  Diagnosis Date  . Hypertension     There are no problems to display for this patient.   No past surgical history on file.     No family history on file.  Social History   Tobacco Use  . Smoking status: Current Every Day Smoker  . Smokeless tobacco: Never Used  Vaping Use  . Vaping Use: Never used  Substance Use Topics  . Alcohol use: No    Alcohol/week: 0.0 standard drinks  . Drug use: No    Home Medications Prior to Admission medications   Not on File    Allergies    Patient has no known allergies.  Review of Systems   Review of Systems  Constitutional: Negative for chills and fever.  HENT: Negative for ear pain and sore throat.   Eyes: Negative for pain and visual disturbance.  Respiratory: Positive for chest tightness. Negative for cough and shortness of breath.   Cardiovascular: Positive for chest pain. Negative for palpitations.  Gastrointestinal: Negative for abdominal pain and vomiting.  Genitourinary: Negative for dysuria and hematuria.  Musculoskeletal: Negative for arthralgias and back pain.  Skin: Negative for color change and rash.  Neurological: Negative for seizures and syncope.  All  other systems reviewed and are negative.   Physical Exam Updated Vital Signs BP (!) 201/167   Pulse 92   Resp 19   Ht 5\' 9"  (1.753 m)   Wt 112 kg   SpO2 98%   BMI 36.48 kg/m   Physical Exam Vitals and nursing note reviewed.  Constitutional:      Appearance: He is well-developed.  HENT:     Head: Normocephalic and atraumatic.  Eyes:     Conjunctiva/sclera: Conjunctivae normal.  Cardiovascular:     Rate and Rhythm: Normal rate and regular rhythm.     Heart sounds: No murmur heard.      Comments: Equal radial and pt pulses b/l Pulmonary:     Effort: Pulmonary effort is normal. No respiratory distress.     Breath sounds: Normal breath sounds.  Chest:     Chest wall: No mass or deformity.  Abdominal:     Palpations: Abdomen is soft.     Tenderness: There is no abdominal tenderness.  Musculoskeletal:     Cervical back: Neck supple.  Skin:    General: Skin is warm and dry.  Neurological:     General: No focal deficit present.     Mental Status: He is alert.  Psychiatric:        Mood and Affect: Mood normal.        Behavior: Behavior normal.     ED Results / Procedures / Treatments   Labs (all labs ordered are listed,  but only abnormal results are displayed) Labs Reviewed  BASIC METABOLIC PANEL - Abnormal; Notable for the following components:      Result Value   CO2 20 (*)    Calcium 8.8 (*)    All other components within normal limits  CBC - Abnormal; Notable for the following components:   WBC 13.1 (*)    All other components within normal limits  TROPONIN I (HIGH SENSITIVITY) - Abnormal; Notable for the following components:   Troponin I (High Sensitivity) 384 (*)    All other components within normal limits  RESPIRATORY PANEL BY RT PCR (FLU A&B, COVID)  PROTIME-INR  APTT  HEPARIN LEVEL (UNFRACTIONATED)  TROPONIN I (HIGH SENSITIVITY)    EKG EKG Interpretation  Date/Time:  Saturday May 18 2020 23:42:32 EDT Ventricular Rate:  97 PR Interval:     QRS Duration: 108 QT Interval:  382 QTC Calculation: 486 R Axis:   -67 Text Interpretation: Sinus tachycardia Paired ventricular premature complexes Left anterior fascicular block Abnormal R-wave progression, late transition LVH with secondary repolarization abnormality Borderline prolonged QT interval no acute STEMI Confirmed by Marianna Fuss ([id number]) on 05/18/2020 11:59:58 PM   Radiology DG Chest 2 View  Result Date: 05/18/2020 CLINICAL DATA:  Chest pain. EXAM: CHEST - 2 VIEW COMPARISON:  None. FINDINGS: Heart is normal in size.The cardiomediastinal contours are normal. There is peribronchial thickening. Increased AP diameter of the thorax. Pulmonary vasculature is normal. No consolidation, pleural effusion, or pneumothorax. Slight flattening of lower thoracic vertebra, likely degenerative. No acute osseous abnormalities are seen. IMPRESSION: Peribronchial thickening suggesting may be related to smoking, bronchitis or asthma. Electronically Signed   By: Narda Rutherford M.D.   On: 05/18/2020 22:46    Procedures .Critical Care Performed by: Milagros Loll, MD Authorized by: Milagros Loll, MD   Critical care provider statement:    Critical care time (minutes):  35   Critical care was necessary to treat or prevent imminent or life-threatening deterioration of the following conditions:  Cardiac failure   Critical care was time spent personally by me on the following activities:  Discussions with consultants, evaluation of patient's response to treatment, examination of patient, ordering and performing treatments and interventions, ordering and review of laboratory studies, ordering and review of radiographic studies, pulse oximetry, re-evaluation of patient's condition, obtaining history from patient or surrogate and review of old charts   (including critical care time)  Medications Ordered in ED Medications  heparin bolus via infusion 4,000 Units (has no administration in time  range)  heparin ADULT infusion 100 units/mL (25000 units/258mL sodium chloride 0.45%) (1,350 Units/hr Intravenous New Bag/Given 05/19/20 0013)  fentaNYL (SUBLIMAZE) injection 50 mcg (50 mcg Intravenous Given 05/19/20 0005)    ED Course  I have reviewed the triage vital signs and the nursing notes.  Pertinent labs & imaging results that were available during my care of the patient were reviewed by me and considered in my medical decision making (see chart for details).  Clinical Course as of May 19 12  Sat May 18, 2020  2331 Evaluated patient, reviewed results, will repeat trop,    [RD]  2336 D/w cards fellow - he will come evaluate and admit, agrees likely NSTEMI/ACS, agrees with hep gtt   [RD]  Sun May 19, 2020  0005 Rechecked pt, Cards at bedside   [RD]    Clinical Course User Index [RD] Milagros Loll, MD   MDM Rules/Calculators/A&P  59 year old male history of hypertension, tobacco abuse presents to ER with concern for chest pain. EKG with some ST segment depressions, no acute STEMI. Troponin elevated. Suspect ACS/NSTEMI. Patient received aspirin prior to arrival, provided additional nitroglycerin in ER. C/s pharmacy for heparin. Pain is currently well controlled. Cardiology will come to bedside to admit.  Final Clinical Impression(s) / ED Diagnoses Final diagnoses:  NSTEMI (non-ST elevated myocardial infarction) (HCC)  Acute coronary syndrome [facility])    Rx / DC Orders ED Discharge Orders    None       Milagros Loll, MD 05/19/20 [phone]

## 2020-05-18 NOTE — Progress Notes (Signed)
ANTICOAGULATION CONSULT NOTE - Initial Consult  Pharmacy Consult for Heparin Indication: chest pain/ACS  No Known Allergies  Patient Measurements: Height: 5\' 9"  (175.3 cm) Weight: 112 kg (247 lb) IBW/kg (Calculated) : 70.7 Heparin Dosing Weight: 95 kg  Vital Signs: BP: 180/84 (11/06 2230) Pulse Rate: 90 (11/06 2230)  Labs: Recent Labs    05/18/20 2229  HGB 14.7  HCT 45.0  PLT 249  CREATININE 1.05  TROPONINIHS 384*    Estimated Creatinine Clearance: 91.1 mL/min (by C-G formula based on SCr of 1.05 mg/dL).   Medical History: Past Medical History:  Diagnosis Date  . Hypertension     Medications:  No current facility-administered medications on file prior to encounter.   Current Outpatient Medications on File Prior to Encounter  Medication Sig Dispense Refill  . ciprofloxacin (CIPRO) 500 MG tablet Take 1 tablet (500 mg total) by mouth every 12 (twelve) hours. 10 tablet 0  . doxycycline (VIBRAMYCIN) 100 MG capsule Take 1 capsule (100 mg total) by mouth 2 (two) times daily. (Patient not taking: Reported on 07/30/2017) 20 capsule 0  . fluticasone (FLONASE) 50 MCG/ACT nasal spray Place 2 sprays into both nostrils daily as needed for allergies or rhinitis.    08/01/2017 ibuprofen (ADVIL,MOTRIN) 600 MG tablet Take 1 tablet (600 mg total) by mouth every 6 (six) hours as needed. 30 tablet 0  . loratadine (CLARITIN) 10 MG tablet Take 10 mg by mouth daily as needed for allergies or rhinitis.    . naproxen sodium (ALEVE) 220 MG tablet Take 220 mg by mouth every morning.       Assessment: 59 y.o. male with chest pain for heparin  Goal of Therapy:  Heparin level 0.3-0.7 units/ml Monitor platelets by anticoagulation protocol: Yes   Plan:  Heparin 4000 units IV bolus, then start heparin 1350 units/hr Check heparin level in 8 hours.   77 05/18/2020,11:36 PM

## 2020-05-18 NOTE — H&P (Signed)
Cardiology Admission History and Physical:   Patient ID: Erik Armstrong MRN: [id number]; DOB: 02-Apr-1961   Admission date: 05/18/2020  Primary Care Provider: Patient, No Pcp Per Primary Cardiologist: No primary care provider on file.  Primary Electrophysiologist:  None   Chief Complaint:  Chest pain at rest   Patient Profile:   Erik Armstrong is a 59 y.o. male with HTN and tobacco abuse presenting with 1 day of chest pain at rest found to have NSTEMI.  History of Present Illness:   Erik Armstrong is a 59 yo male with a history of HTN (not on meds) and ongoing tobacco abuse (smokes ~ 0.5 PPD for many year) who started having chest pain earlier in the day on 11/6. Substernal left sided pain that is nonradiating. No other symptoms. Pain has been waxing and waning and went away for a few hours but came back more prominently after 6 pm which prompted him to come in for evaluation.  On arrival to the ED, he was found to have elevated troponin to 300s, some Std. Mild leukocytosis. Significantly elevated blood pressure. Notes that he has been diagnosed with HTN for quite a while but lost insurance a while back and has not been on any medications.     Past Medical History:  Diagnosis Date  . Hypertension     No past surgical history on file.   Medications Prior to Admission: Prior to Admission medications   Medication Sig Start Date End Date Taking? Authorizing Provider  ciprofloxacin (CIPRO) 500 MG tablet Take 1 tablet (500 mg total) by mouth every 12 (twelve) hours. 07/31/17   Maxwell Caul, PA-C  doxycycline (VIBRAMYCIN) 100 MG capsule Take 1 capsule (100 mg total) by mouth 2 (two) times daily. Patient not taking: Reported on 07/30/2017 12/26/14   Wallis Bamberg, PA-C  fluticasone [facility]) 50 MCG/ACT nasal spray Place 2 sprays into both nostrils daily as needed for allergies or rhinitis.    [provider]  ibuprofen (ADVIL,MOTRIN) 600 MG tablet Take 1 tablet (600 mg total) by  mouth every 6 (six) hours as needed. 07/31/17   Maxwell Caul, PA-C  loratadine (CLARITIN) 10 MG tablet Take 10 mg by mouth daily as needed for allergies or rhinitis.    [provider]  naproxen sodium (ALEVE) 220 MG tablet Take 220 mg by mouth every morning.    [provider]     Allergies:   No Known Allergies  Social History:   Social History   Socioeconomic History  . Marital status: Married    Spouse name: Not on file  . Number of children: Not on file  . Years of education: Not on file  . Highest education level: Not on file  Occupational History  . Not on file  Tobacco Use  . Smoking status: Current Every Day Smoker  . Smokeless tobacco: Never Used  Vaping Use  . Vaping Use: Never used  Substance and Sexual Activity  . Alcohol use: No    Alcohol/week: 0.0 standard drinks  . Drug use: No  . Sexual activity: Not on file  Other Topics Concern  . Not on file  Social History Narrative  . Not on file   Social Determinants of Health   Financial Resource Strain:   . Difficulty of Paying Living Expenses: Not on file  Food Insecurity:   . Worried About Programme researcher, broadcasting/film/video in the Last Year: Not on file  . Ran Out of Food in the Last  Year: Not on file  Transportation Needs:   . Lack of Transportation (Medical): Not on file  . Lack of Transportation (Non-Medical): Not on file  Physical Activity:   . Days of Exercise per Week: Not on file  . Minutes of Exercise per Session: Not on file  Stress:   . Feeling of Stress : Not on file  Social Connections:   . Frequency of Communication with Friends and Family: Not on file  . Frequency of Social Gatherings with Friends and Family: Not on file  . Attends Religious Services: Not on file  . Active Member of Clubs or Organizations: Not on file  . Attends BankerClub or Organization Meetings: Not on file  . Marital Status: Not on file  Intimate Partner Violence:   . Fear of Current or Ex-Partner: Not on file  .  Emotionally Abused: Not on file  . Physically Abused: Not on file  . Sexually Abused: Not on file    Family History:  Mother has history of CAD and MI, unsure of age  The patient's family history is not on file.    Review of Systems: [y] = yes, [ ]  = no     General: Weight gain [ ] ; Weight loss [ ] ; Anorexia [ ] ; Fatigue [ ] ; Fever [ ] ; Chills [ ] ; Weakness [ ]    Cardiac: Chest pain/pressure [ y]; Resting SOB [ ] ; Exertional SOB [ ] ; Orthopnea [ ] ; Pedal Edema [ ] ; Palpitations [ ] ; Syncope [ ] ; Presyncope [ ] ; Paroxysmal nocturnal dyspnea[ ]    Pulmonary: Cough [ ] ; Wheezing[ ] ; Hemoptysis[ ] ; Sputum [ ] ; Snoring [ ]    GI: Vomiting[ ] ; Dysphagia[ ] ; Melena[ ] ; Hematochezia [ ] ; Heartburn[ ] ; Abdominal pain [ ] ; Constipation [ ] ; Diarrhea [ ] ; BRBPR [ ]    GU: Hematuria[ ] ; Dysuria [ ] ; Nocturia[ ]    Vascular: Pain in legs with walking [ ] ; Pain in feet with lying flat [ ] ; Non-healing sores [ ] ; Stroke [ ] ; TIA [ ] ; Slurred speech [ ] ;   Neuro: Headaches[ ] ; Vertigo[ ] ; Seizures[ ] ; Paresthesias[ ] ;Blurred vision [ ] ; Diplopia [ ] ; Vision changes [ ]    Ortho/Skin: Arthritis [ ] ; Joint pain [ ] ; Muscle pain [ ] ; Joint swelling [ ] ; Back Pain [ ] ; Rash [ ]    Psych: Depression[ ] ; Anxiety[ ]    Heme: Bleeding problems [ ] ; Clotting disorders [ ] ; Anemia [ ]    Endocrine: Diabetes [ ] ; Thyroid dysfunction[ ]   Physical Exam/Data:   Vitals:   05/18/20 2230 05/18/20 2236 05/18/20 2254  BP: (!) 180/84    Pulse: 90    Resp: 20    SpO2: 97% 99%   Weight:   112 kg  Height:   5\' 9"  (1.753 m)   No intake or output data in the 24 hours ending 05/18/20 2337 Filed Weights   05/18/20 2254  Weight: 112 kg   Body mass index is 36.48 kg/m.  General:  Mild distress from chest pain.  HEENT: normal Lymph: no adenopathy Neck: no JVD Endocrine:  No thryomegaly Vascular: No carotid bruits; FA pulses 2+ bilaterally without bruits  Cardiac:  normal S1, S2; RRR; soft systolic murmur heard  loudest at apex  Lungs:  clear to auscultation bilaterally, no wheezing, rhonchi or rales  Abd: soft, nontender, no hepatomegaly  Ext: no edema Musculoskeletal:  No deformities, BUE and BLE strength normal and equal Skin: warm and dry  Neuro:  CNs 2-12 intact, no focal abnormalities noted  Psych:  Normal affect    EKG:  The ECG that was done in the ED was personally reviewed and demonstrates sinus rhythm with aVR elevation and Std in V4-6  Relevant CV Studies: none  Laboratory Data:  Chemistry Recent Labs  Lab 05/18/20 2229  NA 138  K 4.5  CL 105  CO2 20*  GLUCOSE 94  BUN 17  CREATININE 1.05  CALCIUM 8.8*  GFRNONAA >60  ANIONGAP 13    No results for input(s): PROT, ALBUMIN, AST, ALT, ALKPHOS, BILITOT in the last 168 hours. Hematology Recent Labs  Lab 05/18/20 2229  WBC 13.1*  RBC 4.83  HGB 14.7  HCT 45.0  MCV 93.2  MCH 30.4  MCHC 32.7  RDW 13.3  PLT 249   Cardiac EnzymesNo results for input(s): TROPONINI in the last 168 hours. No results for input(s): TROPIPOC in the last 168 hours.  BNPNo results for input(s): BNP, PROBNP in the last 168 hours.  DDimer No results for input(s): DDIMER in the last 168 hours.  Radiology/Studies:  DG Chest 2 View  Result Date: 05/18/2020 CLINICAL DATA:  Chest pain. EXAM: CHEST - 2 VIEW COMPARISON:  None. FINDINGS: Heart is normal in size.The cardiomediastinal contours are normal. There is peribronchial thickening. Increased AP diameter of the thorax. Pulmonary vasculature is normal. No consolidation, pleural effusion, or pneumothorax. Slight flattening of lower thoracic vertebra, likely degenerative. No acute osseous abnormalities are seen. IMPRESSION: Peribronchial thickening suggesting may be related to smoking, bronchitis or asthma. Electronically Signed   By: Narda Rutherford M.D.   On: 05/18/2020 22:46    Assessment and Plan:   1. NSTEMI. Presenting with 1 day of chest pain with active tobacco abuse and untreated HTN. Given  ASA 325 and nitro en route with EMS. Initial hsTnI 384. Will admit to cardiology service for further ACS management and cardiac cath on Monday.  - start heparin drip for ACS - ASA 81 mg daily - atorvastatin 80 mg (first dose tonight)  - will start carvedilol for BP control 3.125 mg bid and can titrate up as tolerated; hold on ACEi until after cath  - will start nitro gtt given extremely elevated BP may be exacerbating symptoms  - order echo - lipids, A1c, tsh, hiv  - smoking cessation counseling prior to discharge   2. Hypertensive urgency. As noted above with active chest pain unclear if this is triggering or exacerbating NSTEMI. So will treat with nitro gtt to get better control.  Severity of Illness: The appropriate patient status for this patient is INPATIENT. Inpatient status is judged to be reasonable and necessary in order to provide the required intensity of service to ensure the patient's safety. The patient's presenting symptoms, physical exam findings, and initial radiographic and laboratory data in the context of their chronic comorbidities is felt to place them at high risk for further clinical deterioration. Furthermore, it is not anticipated that the patient will be medically stable for discharge from the hospital within 2 midnights of admission. The following factors support the patient status of inpatient.   " The patient's presenting symptoms include chest pain at rest. " The worrisome physical exam findings include chest pain, dyspnea. " The initial radiographic and laboratory data are worrisome because of ST depression and elevated troponin. " The chronic co-morbidities include HTN, tobacco abuse.   * I certify that at the point of admission it is my clinical judgment that the patient will require inpatient hospital care spanning beyond 2 midnights from the point  of admission due to high intensity of service, high risk for further deterioration and high frequency of  surveillance required.*    For questions or updates, please contact [facility] Please consult [email or web address] for contact info under        Signed, Joellen Jersey, MD  05/18/2020 11:37 PM

## 2020-05-18 NOTE — ED Notes (Signed)
Pt ambulatory to BR

## 2020-05-19 ENCOUNTER — Encounter (HOSPITAL_COMMUNITY): Payer: Self-pay | Admitting: Internal Medicine

## 2020-05-19 ENCOUNTER — Inpatient Hospital Stay (HOSPITAL_COMMUNITY): Payer: Managed Care, Other (non HMO)

## 2020-05-19 DIAGNOSIS — R011 Cardiac murmur, unspecified: Secondary | ICD-10-CM | POA: Diagnosis present

## 2020-05-19 DIAGNOSIS — I214 Non-ST elevation (NSTEMI) myocardial infarction: Secondary | ICD-10-CM | POA: Diagnosis present

## 2020-05-19 DIAGNOSIS — Z8249 Family history of ischemic heart disease and other diseases of the circulatory system: Secondary | ICD-10-CM | POA: Diagnosis not present

## 2020-05-19 DIAGNOSIS — I358 Other nonrheumatic aortic valve disorders: Secondary | ICD-10-CM | POA: Diagnosis present

## 2020-05-19 DIAGNOSIS — I351 Nonrheumatic aortic (valve) insufficiency: Secondary | ICD-10-CM | POA: Diagnosis present

## 2020-05-19 DIAGNOSIS — E785 Hyperlipidemia, unspecified: Secondary | ICD-10-CM | POA: Diagnosis present

## 2020-05-19 DIAGNOSIS — R072 Precordial pain: Secondary | ICD-10-CM | POA: Diagnosis present

## 2020-05-19 DIAGNOSIS — D72829 Elevated white blood cell count, unspecified: Secondary | ICD-10-CM | POA: Diagnosis present

## 2020-05-19 DIAGNOSIS — R079 Chest pain, unspecified: Secondary | ICD-10-CM | POA: Diagnosis not present

## 2020-05-19 DIAGNOSIS — F17200 Nicotine dependence, unspecified, uncomplicated: Secondary | ICD-10-CM | POA: Diagnosis present

## 2020-05-19 DIAGNOSIS — I160 Hypertensive urgency: Secondary | ICD-10-CM | POA: Diagnosis present

## 2020-05-19 DIAGNOSIS — I10 Essential (primary) hypertension: Secondary | ICD-10-CM | POA: Diagnosis present

## 2020-05-19 DIAGNOSIS — Z791 Long term (current) use of non-steroidal anti-inflammatories (NSAID): Secondary | ICD-10-CM | POA: Diagnosis not present

## 2020-05-19 DIAGNOSIS — Z720 Tobacco use: Secondary | ICD-10-CM | POA: Diagnosis not present

## 2020-05-19 DIAGNOSIS — Z20822 Contact with and (suspected) exposure to covid-19: Secondary | ICD-10-CM | POA: Diagnosis present

## 2020-05-19 DIAGNOSIS — I350 Nonrheumatic aortic (valve) stenosis: Secondary | ICD-10-CM | POA: Diagnosis not present

## 2020-05-19 DIAGNOSIS — I2510 Atherosclerotic heart disease of native coronary artery without angina pectoris: Secondary | ICD-10-CM | POA: Diagnosis present

## 2020-05-19 LAB — LIPID PANEL
Cholesterol: 202 mg/dL — ABNORMAL HIGH (ref 0–200)
HDL: 29 mg/dL — ABNORMAL LOW (ref >40)
LDL Cholesterol: 149 mg/dL — ABNORMAL HIGH (ref 0–99)
Total CHOL/HDL Ratio: 7 RATIO
Triglycerides: 120 mg/dL (ref <150)
VLDL: 24 mg/dL (ref 0–40)

## 2020-05-19 LAB — COMPREHENSIVE METABOLIC PANEL
ALT: 17 U/L (ref 0–44)
AST: 29 U/L (ref 15–41)
Albumin: 3.4 g/dL — ABNORMAL LOW (ref 3.5–5.0)
Alkaline Phosphatase: 100 U/L (ref 38–126)
Anion gap: 12 (ref 5–15)
BUN: 17 mg/dL (ref 8–23)
CO2: 21 mmol/L — ABNORMAL LOW (ref 22–32)
Calcium: 8.7 mg/dL — ABNORMAL LOW (ref 8.9–10.3)
Chloride: 104 mmol/L (ref 98–111)
Creatinine, Ser: 1 mg/dL (ref 0.61–1.24)
GFR, Estimated: >60 mL/min (ref >60)
Glucose, Bld: 93 mg/dL (ref 70–99)
Potassium: 4.3 mmol/L (ref 3.5–5.1)
Sodium: 137 mmol/L (ref 135–145)
Total Bilirubin: 0.6 mg/dL (ref 0.3–1.2)
Total Protein: 6.7 g/dL (ref 6.5–8.1)

## 2020-05-19 LAB — PROTIME-INR
INR: 1 (ref 0.8–1.2)
INR: 1 (ref 0.8–1.2)
Prothrombin Time: 12.4 seconds (ref 11.4–15.2)
Prothrombin Time: 12.7 seconds (ref 11.4–15.2)

## 2020-05-19 LAB — HIV ANTIBODY (ROUTINE TESTING W REFLEX): HIV Screen 4th Generation wRfx: NONREACTIVE

## 2020-05-19 LAB — HEMOGLOBIN A1C
Hgb A1c MFr Bld: 5.4 % (ref 4.8–5.6)
Mean Plasma Glucose: 108.28 mg/dL

## 2020-05-19 LAB — HEPARIN LEVEL (UNFRACTIONATED)
Heparin Unfractionated: <0.1 IU/mL — ABNORMAL LOW (ref 0.30–0.70)
Heparin Unfractionated: 0.12 IU/mL — ABNORMAL LOW (ref 0.30–0.70)

## 2020-05-19 LAB — BRAIN NATRIURETIC PEPTIDE: B Natriuretic Peptide: 63 pg/mL (ref 0.0–100.0)

## 2020-05-19 LAB — RESPIRATORY PANEL BY RT PCR (FLU A&B, COVID)
Influenza A by PCR: NEGATIVE
Influenza B by PCR: NEGATIVE
SARS Coronavirus 2 by RT PCR: NEGATIVE

## 2020-05-19 LAB — TROPONIN I (HIGH SENSITIVITY)
Troponin I (High Sensitivity): 1130 ng/L (ref <18)
Troponin I (High Sensitivity): 631 ng/L (ref <18)

## 2020-05-19 LAB — TSH: TSH: 2.673 u[IU]/mL (ref 0.350–4.500)

## 2020-05-19 LAB — APTT: aPTT: 30 seconds (ref 24–36)

## 2020-05-19 MED ORDER — ACETAMINOPHEN 325 MG PO TABS: 650.0000 mg | ORAL_TABLET | ORAL | Status: DC | PRN

## 2020-05-19 MED ORDER — NITROGLYCERIN IN D5W 200-5 MCG/ML-% IV SOLN
0.0000 ug/min | INTRAVENOUS | Status: DC
  Administered 2020-05-19: 5 ug/min via INTRAVENOUS
  Filled 2020-05-19 (×2): qty 250

## 2020-05-19 MED ORDER — CARVEDILOL 3.125 MG PO TABS
3.1250 mg | ORAL_TABLET | Freq: Two times a day (BID) | ORAL | Status: DC
  Administered 2020-05-19 (×2): 3.125 mg via ORAL
  Filled 2020-05-19 (×2): qty 1

## 2020-05-19 MED ORDER — INFLUENZA VAC SPLIT QUAD 0.5 ML IM SUSY: 0.5000 mL | PREFILLED_SYRINGE | INTRAMUSCULAR | Status: DC | PRN

## 2020-05-19 MED ORDER — SODIUM CHLORIDE 0.9 % IV SOLN: 250.0000 mL | INTRAVENOUS | Status: DC | PRN

## 2020-05-19 MED ORDER — ASPIRIN 81 MG PO CHEW
81.0000 mg | CHEWABLE_TABLET | ORAL | Status: AC
  Administered 2020-05-20: 81 mg via ORAL
  Filled 2020-05-19: qty 1

## 2020-05-19 MED ORDER — CARVEDILOL 6.25 MG PO TABS
6.2500 mg | ORAL_TABLET | Freq: Two times a day (BID) | ORAL | Status: DC
  Administered 2020-05-19: 6.25 mg via ORAL
  Filled 2020-05-19 (×2): qty 1

## 2020-05-19 MED ORDER — SODIUM CHLORIDE 0.9 % WEIGHT BASED INFUSION
3.0000 mL/kg/h | INTRAVENOUS | Status: DC
  Administered 2020-05-20: 3 mL/kg/h via INTRAVENOUS

## 2020-05-19 MED ORDER — ONDANSETRON HCL 4 MG/2ML IJ SOLN: 4.0000 mg | Freq: Four times a day (QID) | INTRAMUSCULAR | Status: DC | PRN

## 2020-05-19 MED ORDER — ASPIRIN EC 81 MG PO TBEC
81.0000 mg | DELAYED_RELEASE_TABLET | Freq: Every day | ORAL | Status: DC
  Administered 2020-05-19 – 2020-05-21 (×2): 81 mg via ORAL
  Filled 2020-05-19 (×2): qty 1

## 2020-05-19 MED ORDER — ATORVASTATIN CALCIUM 80 MG PO TABS
80.0000 mg | ORAL_TABLET | Freq: Every day | ORAL | Status: DC
  Administered 2020-05-19 (×2): 80 mg via ORAL
  Filled 2020-05-19: qty 1
  Filled 2020-05-19: qty 2

## 2020-05-19 MED ORDER — HEPARIN BOLUS VIA INFUSION
2900.0000 [IU] | Freq: Once | INTRAVENOUS | Status: AC
  Administered 2020-05-19: 2900 [IU] via INTRAVENOUS
  Filled 2020-05-19: qty 2900

## 2020-05-19 MED ORDER — SODIUM CHLORIDE 0.9 % WEIGHT BASED INFUSION
1.0000 mL/kg/h | INTRAVENOUS | Status: DC
  Administered 2020-05-20 (×2): 1 mL/kg/h via INTRAVENOUS

## 2020-05-19 MED ORDER — SODIUM CHLORIDE 0.9% FLUSH: 3.0000 mL | INTRAVENOUS | Status: DC | PRN

## 2020-05-19 MED ORDER — SODIUM CHLORIDE 0.9% FLUSH: 3.0000 mL | Freq: Two times a day (BID) | INTRAVENOUS | Status: DC

## 2020-05-19 NOTE — ED Notes (Signed)
Report given to rn on 6e 

## 2020-05-19 NOTE — ED Notes (Signed)
No chest pain wife at  The bedside pt looking forward to having a bed upstairs to have wires removed

## 2020-05-19 NOTE — Progress Notes (Signed)
ANTICOAGULATION CONSULT NOTE - Initial Consult  Pharmacy Consult for Heparin Indication: chest pain/ACS  No Known Allergies  Patient Measurements: Height: 5\' 9"  (175.3 cm) Weight: 112 kg (247 lb) IBW/kg (Calculated) : 70.7 Heparin Dosing Weight: 95 kg  Vital Signs: Temp: 98 F (36.7 C) (11/07 0522) Temp Source: Oral (11/07 0522) BP: 175/90 (11/07 1100) Pulse Rate: 77 (11/07 1100)  Labs: Recent Labs    05/18/20 2229 05/19/20 0002 05/19/20 0345 05/19/20 1044  HGB 14.7  --   --   --   HCT 45.0  --   --   --   PLT 249  --   --   --   APTT  --  30  --   --   LABPROT  --  12.7 12.4  --   INR  --  1.0 1.0  --   HEPARINUNFRC  --   --   --  <0.10*  CREATININE 1.05  --  1.00  --   TROPONINIHS 384* 631* 1,130*  --     Estimated Creatinine Clearance: 95.7 mL/min (by C-G formula based on SCr of 1 mg/dL).   Medical History: Past Medical History:  Diagnosis Date   Hypertension     Medications:  No current facility-administered medications on file prior to encounter.   No current outpatient medications on file prior to encounter.     Assessment: 59 y.o. male being admitted for an NSTEMI  trop [phone]. Plans for cath on Monday. Pharmacy has been asked to dose heparin at this time   Goal of Therapy:  Heparin level 0.3-0.7 units/ml Monitor platelets by anticoagulation protocol: Yes   Plan:  - Heparin level < 0.1 - Will give Heparin bolus 2900 units IV x 1 dose  - Will also increase heparin drip to 1650 units/hr  - Obtain Heparin level in ~ 6 hr after changes  - Monitor patient for s/s of bleeding and cbc   Thursday 05/19/2020,11:28 AM

## 2020-05-19 NOTE — Progress Notes (Signed)
Progress Note  Patient Name: Erik Armstrong Date of Encounter: 05/19/2020  Primary Cardiologist: New   Subjective   No chest pain or shortness of breath at rest.  Inpatient Medications    Scheduled Meds: . aspirin EC  81 mg Oral Daily  . atorvastatin  80 mg Oral Daily  . carvedilol  3.125 mg Oral BID WC   Continuous Infusions: . heparin 1,350 Units/hr (05/19/20 0920)  . nitroGLYCERIN 30 mcg/min (05/19/20 0642)   PRN Meds: acetaminophen, ondansetron (ZOFRAN) IV   Vital Signs    Vitals:   05/19/20 0915 05/19/20 0918 05/19/20 0945 05/19/20 1000  BP: (!) 163/98 (!) 163/68 (!) 163/78 (!) 145/76  Pulse: (!) 105  86 77  Resp: 16  20 16   Temp:      TempSrc:      SpO2: 92%  93% 96%  Weight:      Height:       No intake or output data in the 24 hours ending 05/19/20 1018 Filed Weights   05/18/20 2254  Weight: 112 kg    Telemetry    Sinus rhythm with PVCs.  Personally reviewed.  ECG    An ECG dated 05/18/2020 was personally reviewed today and demonstrated:  Sinus rhythm with LVH, PVCs, left anterior fascicular block.  Physical Exam   GEN: No acute distress.   Neck: No JVD. Cardiac: RRR, soft systolic murmur, no gallop.  Respiratory: Nonlabored. Clear to auscultation bilaterally. GI: Soft, nontender, bowel sounds present. MS: No edema; No deformity. Neuro:  Nonfocal. Psych: Alert and oriented x 3. Normal affect.  Labs    Chemistry Recent Labs  Lab 05/18/20 2229 05/19/20 0345  NA 138 137  K 4.5 4.3  CL 105 104  CO2 20* 21*  GLUCOSE 94 93  BUN 17 17  CREATININE 1.05 1.00  CALCIUM 8.8* 8.7*  PROT  --  6.7  ALBUMIN  --  3.4*  AST  --  29  ALT  --  17  ALKPHOS  --  100  BILITOT  --  0.6  GFRNONAA >60 >60  ANIONGAP 13 12     Hematology Recent Labs  Lab 05/18/20 2229  WBC 13.1*  RBC 4.83  HGB 14.7  HCT 45.0  MCV 93.2  MCH 30.4  MCHC 32.7  RDW 13.3  PLT 249    Cardiac Enzymes Recent Labs  Lab 05/18/20 2229 05/19/20 0002  05/19/20 0345  TROPONINIHS 384* 631* 1,130*    BNP Recent Labs  Lab 05/19/20 0355  BNP 63.0     Radiology    DG Chest 2 View  Result Date: 05/18/2020 CLINICAL DATA:  Chest pain. EXAM: CHEST - 2 VIEW COMPARISON:  None. FINDINGS: Heart is normal in size.The cardiomediastinal contours are normal. There is peribronchial thickening. Increased AP diameter of the thorax. Pulmonary vasculature is normal. No consolidation, pleural effusion, or pneumothorax. Slight flattening of lower thoracic vertebra, likely degenerative. No acute osseous abnormalities are seen. IMPRESSION: Peribronchial thickening suggesting may be related to smoking, bronchitis or asthma. Electronically Signed   By: 13/12/2019 M.D.   On: 05/18/2020 22:46    Patient Profile     59 y.o. male with a history of untreated hypertension, tobacco abuse, LDL 149, and no regular medical follow-up presenting with recent onset chest discomfort and evidence of NSTEMI as well as hypertensive urgency.  Assessment & Plan    1.  NSTEMI, recent onset of chest discomfort, waxing and waning in the last 24 hours.  Currently chest pain-free.  High-sensitivity troponin I up to 1130.  ECG nonspecific.  2.  Tobacco abuse.  3.  LDL 149.  Patient awaiting bed for hospital admission.  Currently chest pain-free, blood pressure coming down.  He is on aspirin, Lipitor, Coreg which will be uptitrated to 6.25 mg twice daily, heparin, and IV nitroglycerin.  Check echocardiogram.  Anticipation addition of ARB next.  He will be scheduled for cardiac catheterization tomorrow.  Risks and benefits discussed, he is in agreement to proceed.  Signed, Nona Dell, MD  05/19/2020, 10:18 AM

## 2020-05-19 NOTE — Progress Notes (Signed)
ANTICOAGULATION CONSULT NOTE   Pharmacy Consult for Heparin Indication: chest pain/ACS  No Known Allergies  Patient Measurements: Height: 5\' 9"  (175.3 cm) Weight: 106.3 kg (234 lb 6.4 oz) IBW/kg (Calculated) : 70.7 Heparin Dosing Weight: 95 kg  Vital Signs: Temp: 97.8 F (36.6 C) (11/07 1650) Temp Source: Oral (11/07 1650) BP: 159/94 (11/07 1732) Pulse Rate: 94 (11/07 1732)  Labs: Recent Labs    05/18/20 2229 05/19/20 0002 05/19/20 0345 05/19/20 1044 05/19/20 1649  HGB 14.7  --   --   --   --   HCT 45.0  --   --   --   --   PLT 249  --   --   --   --   APTT  --  30  --   --   --   LABPROT  --  12.7 12.4  --   --   INR  --  1.0 1.0  --   --   HEPARINUNFRC  --   --   --  <0.10* 0.12*  CREATININE 1.05  --  1.00  --   --   TROPONINIHS 384* 631* 1,130*  --   --     Estimated Creatinine Clearance: 93.2 mL/min (by C-G formula based on SCr of 1 mg/dL).   Medical History: Past Medical History:  Diagnosis Date   Hypertension     Medications:  No current facility-administered medications on file prior to encounter.   No current outpatient medications on file prior to encounter.     Assessment: 59 y.o. male being admitted for an NSTEMI  trop [phone]. Plans for cath on Monday. Pharmacy has been asked to dose heparin at this time   PM f/u > heparin level below goal this evening at 0.12.  No known issues with IV infusion.  No overt bleeding or complications noted.  Goal of Therapy:  Heparin level 0.3-0.7 units/ml Monitor platelets by anticoagulation protocol: Yes   Plan:  - Increase IV heparin to 1850 units/hr. - Check heparin level in 8 hrs - Daily heparin level and CBC. - Monitor patient for s/s of bleeding and cbc    Monday, Reece Leader, [facility] Clinical Pharmacist  05/19/2020 7:28 PM   [facility] pharmacy phone numbers are listed on [email or web address]

## 2020-05-19 NOTE — Procedures (Signed)
Came bedside in ED for echo, but nurse requests [place] to wait till patient has moved to 6E.

## 2020-05-20 ENCOUNTER — Encounter (HOSPITAL_COMMUNITY)
Admission: EM | Disposition: A | Discharge status: Home / Self Care | Payer: Self-pay | Source: Home / Self Care | Attending: Cardiology

## 2020-05-20 ENCOUNTER — Inpatient Hospital Stay (HOSPITAL_COMMUNITY): Payer: Managed Care, Other (non HMO)

## 2020-05-20 DIAGNOSIS — I351 Nonrheumatic aortic (valve) insufficiency: Secondary | ICD-10-CM

## 2020-05-20 DIAGNOSIS — I350 Nonrheumatic aortic (valve) stenosis: Secondary | ICD-10-CM

## 2020-05-20 DIAGNOSIS — R079 Chest pain, unspecified: Secondary | ICD-10-CM

## 2020-05-20 DIAGNOSIS — Z720 Tobacco use: Secondary | ICD-10-CM

## 2020-05-20 DIAGNOSIS — I2510 Atherosclerotic heart disease of native coronary artery without angina pectoris: Secondary | ICD-10-CM

## 2020-05-20 DIAGNOSIS — E785 Hyperlipidemia, unspecified: Secondary | ICD-10-CM

## 2020-05-20 HISTORY — PX: LEFT HEART CATH AND CORONARY ANGIOGRAPHY: CATH118249

## 2020-05-20 HISTORY — PX: CORONARY STENT INTERVENTION: CATH118234

## 2020-05-20 LAB — BASIC METABOLIC PANEL
Anion gap: 7 (ref 5–15)
BUN: 18 mg/dL (ref 8–23)
CO2: 22 mmol/L (ref 22–32)
Calcium: 8.6 mg/dL — ABNORMAL LOW (ref 8.9–10.3)
Chloride: 106 mmol/L (ref 98–111)
Creatinine, Ser: 1.02 mg/dL (ref 0.61–1.24)
GFR, Estimated: >60 mL/min (ref >60)
Glucose, Bld: 103 mg/dL — ABNORMAL HIGH (ref 70–99)
Potassium: 4.8 mmol/L (ref 3.5–5.1)
Sodium: 135 mmol/L (ref 135–145)

## 2020-05-20 LAB — CBC
HCT: 43.2 % (ref 39.0–52.0)
Hemoglobin: 14.2 g/dL (ref 13.0–17.0)
MCH: 30.4 pg (ref 26.0–34.0)
MCHC: 32.9 g/dL (ref 30.0–36.0)
MCV: 92.5 fL (ref 80.0–100.0)
Platelets: 215 10*3/uL (ref 150–400)
RBC: 4.67 MIL/uL (ref 4.22–5.81)
RDW: 13.5 % (ref 11.5–15.5)
WBC: 12.4 10*3/uL — ABNORMAL HIGH (ref 4.0–10.5)
nRBC: 0 % (ref 0.0–0.2)

## 2020-05-20 LAB — ECHOCARDIOGRAM COMPLETE
AR max vel: 1.9 cm2
AV Area VTI: 1.75 cm2
AV Area mean vel: 1.64 cm2
AV Mean grad: 12 mmHg
AV Peak grad: 17.8 mmHg
Ao pk vel: 2.11 m/s
Area-P 1/2: 3.91 cm2
Height: 69 in
S' Lateral: 3.3 cm
Weight: 3764.8 oz

## 2020-05-20 LAB — HEPARIN LEVEL (UNFRACTIONATED): Heparin Unfractionated: 0.18 IU/mL — ABNORMAL LOW (ref 0.30–0.70)

## 2020-05-20 LAB — POCT ACTIVATED CLOTTING TIME: Activated Clotting Time: 252 seconds

## 2020-05-20 SURGERY — LEFT HEART CATH AND CORONARY ANGIOGRAPHY
Anesthesia: LOCAL

## 2020-05-20 MED ORDER — IOHEXOL 350 MG/ML SOLN
INTRAVENOUS | Status: DC | PRN
  Administered 2020-05-20: 140 mL via INTRA_ARTERIAL

## 2020-05-20 MED ORDER — NITROGLYCERIN 1 MG/10 ML FOR IR/CATH LAB
INTRA_ARTERIAL | Status: AC
  Filled 2020-05-20: qty 10

## 2020-05-20 MED ORDER — HEPARIN BOLUS VIA INFUSION
3000.0000 [IU] | Freq: Once | INTRAVENOUS | Status: AC
  Administered 2020-05-20: 3000 [IU] via INTRAVENOUS
  Filled 2020-05-20: qty 3000

## 2020-05-20 MED ORDER — SODIUM CHLORIDE 0.9% FLUSH
3.0000 mL | Freq: Two times a day (BID) | INTRAVENOUS | Status: DC
  Administered 2020-05-20 – 2020-05-21 (×2): 3 mL via INTRAVENOUS

## 2020-05-20 MED ORDER — HEPARIN SODIUM (PORCINE) 1000 UNIT/ML IJ SOLN
INTRAMUSCULAR | Status: AC
  Filled 2020-05-20: qty 1

## 2020-05-20 MED ORDER — HEPARIN (PORCINE) IN NACL 1000-0.9 UT/500ML-% IV SOLN
INTRAVENOUS | Status: DC | PRN
  Administered 2020-05-20 (×2): 500 mL

## 2020-05-20 MED ORDER — CARVEDILOL 12.5 MG PO TABS
12.5000 mg | ORAL_TABLET | Freq: Two times a day (BID) | ORAL | Status: DC
  Administered 2020-05-20 – 2020-05-21 (×3): 12.5 mg via ORAL
  Filled 2020-05-20 (×3): qty 1

## 2020-05-20 MED ORDER — LABETALOL HCL 5 MG/ML IV SOLN: 10.0000 mg | INTRAVENOUS | Status: AC | PRN

## 2020-05-20 MED ORDER — TICAGRELOR 90 MG PO TABS
ORAL_TABLET | ORAL | Status: AC
  Filled 2020-05-20: qty 2

## 2020-05-20 MED ORDER — TICAGRELOR 90 MG PO TABS
ORAL_TABLET | ORAL | Status: DC | PRN
  Administered 2020-05-20: 180 mg via ORAL

## 2020-05-20 MED ORDER — FENTANYL CITRATE (PF) 100 MCG/2ML IJ SOLN
INTRAMUSCULAR | Status: AC
  Filled 2020-05-20: qty 2

## 2020-05-20 MED ORDER — HEPARIN (PORCINE) IN NACL 1000-0.9 UT/500ML-% IV SOLN
INTRAVENOUS | Status: AC
  Filled 2020-05-20: qty 1000

## 2020-05-20 MED ORDER — HEPARIN SODIUM (PORCINE) 1000 UNIT/ML IJ SOLN
INTRAMUSCULAR | Status: DC | PRN
  Administered 2020-05-20: 5500 [IU] via INTRAVENOUS
  Administered 2020-05-20: 2500 [IU] via INTRAVENOUS
  Administered 2020-05-20: 6000 [IU] via INTRAVENOUS

## 2020-05-20 MED ORDER — ACETAMINOPHEN 325 MG PO TABS: 650.0000 mg | ORAL_TABLET | ORAL | Status: DC | PRN

## 2020-05-20 MED ORDER — SODIUM CHLORIDE 0.9 % IV SOLN: INTRAVENOUS | Status: AC

## 2020-05-20 MED ORDER — FENTANYL CITRATE (PF) 100 MCG/2ML IJ SOLN
INTRAMUSCULAR | Status: DC | PRN
  Administered 2020-05-20: 25 ug via INTRAVENOUS

## 2020-05-20 MED ORDER — LIDOCAINE HCL (PF) 1 % IJ SOLN
INTRAMUSCULAR | Status: DC | PRN
  Administered 2020-05-20: 2 mL

## 2020-05-20 MED ORDER — MIDAZOLAM HCL 2 MG/2ML IJ SOLN
INTRAMUSCULAR | Status: AC
  Filled 2020-05-20: qty 2

## 2020-05-20 MED ORDER — LIDOCAINE HCL (PF) 1 % IJ SOLN
INTRAMUSCULAR | Status: AC
  Filled 2020-05-20: qty 30

## 2020-05-20 MED ORDER — SODIUM CHLORIDE 0.9% FLUSH: 3.0000 mL | INTRAVENOUS | Status: DC | PRN

## 2020-05-20 MED ORDER — VERAPAMIL HCL 2.5 MG/ML IV SOLN
INTRA_ARTERIAL | Status: DC | PRN
  Administered 2020-05-20: 3 mL via INTRA_ARTERIAL
  Administered 2020-05-20: 4 mL via INTRA_ARTERIAL

## 2020-05-20 MED ORDER — ASPIRIN 81 MG PO CHEW: 81.0000 mg | CHEWABLE_TABLET | Freq: Every day | ORAL | Status: DC

## 2020-05-20 MED ORDER — TICAGRELOR 90 MG PO TABS
90.0000 mg | ORAL_TABLET | Freq: Two times a day (BID) | ORAL | Status: DC
  Administered 2020-05-20 – 2020-05-21 (×2): 90 mg via ORAL
  Filled 2020-05-20 (×2): qty 1

## 2020-05-20 MED ORDER — VERAPAMIL HCL 2.5 MG/ML IV SOLN
INTRAVENOUS | Status: AC
  Filled 2020-05-20: qty 2

## 2020-05-20 MED ORDER — HYDRALAZINE HCL 20 MG/ML IJ SOLN: 10.0000 mg | INTRAMUSCULAR | Status: AC | PRN

## 2020-05-20 MED ORDER — SODIUM CHLORIDE 0.9 % IV SOLN: 250.0000 mL | INTRAVENOUS | Status: DC | PRN

## 2020-05-20 MED ORDER — MORPHINE SULFATE (PF) 2 MG/ML IV SOLN: 2.0000 mg | INTRAVENOUS | Status: DC | PRN

## 2020-05-20 MED ORDER — ONDANSETRON HCL 4 MG/2ML IJ SOLN: 4.0000 mg | Freq: Four times a day (QID) | INTRAMUSCULAR | Status: DC | PRN

## 2020-05-20 MED ORDER — ATORVASTATIN CALCIUM 80 MG PO TABS
80.0000 mg | ORAL_TABLET | Freq: Every day | ORAL | Status: DC
  Administered 2020-05-20 – 2020-05-21 (×2): 80 mg via ORAL
  Filled 2020-05-20 (×2): qty 1

## 2020-05-20 MED ORDER — MIDAZOLAM HCL 2 MG/2ML IJ SOLN
INTRAMUSCULAR | Status: DC | PRN
  Administered 2020-05-20: 1 mg via INTRAVENOUS

## 2020-05-20 SURGICAL SUPPLY — 19 items
BALLN EMERGE MR 2.0X12 (BALLOONS) ×2
BALLOON EMERGE MR 2.0X12 (BALLOONS) IMPLANT
CATH INFINITI 5 FR JL3.5 (CATHETERS) ×1 IMPLANT
CATH INFINITI 5FR ANG PIGTAIL (CATHETERS) ×1 IMPLANT
CATH OPTITORQUE TIG 4.0 5F (CATHETERS) ×1 IMPLANT
CATH VISTA GUIDE 6FR XB3.5 (CATHETERS) ×1 IMPLANT
DEVICE RAD COMP TR BAND LRG (VASCULAR PRODUCTS) ×1 IMPLANT
GLIDESHEATH SLEND A-KIT 6F 22G (SHEATH) ×1 IMPLANT
GUIDEWIRE INQWIRE 1.5J.035X260 (WIRE) IMPLANT
INQWIRE 1.5J .035X260CM (WIRE) ×2
KIT ENCORE 26 ADVANTAGE (KITS) ×1 IMPLANT
KIT HEART LEFT (KITS) ×2 IMPLANT
PACK CARDIAC CATHETERIZATION (CUSTOM PROCEDURE TRAY) ×2 IMPLANT
STENT SYNERGY XD 3.0X16 (Permanent Stent) IMPLANT
SYNERGY XD 3.0X16 (Permanent Stent) ×2 IMPLANT
TRANSDUCER W/STOPCOCK (MISCELLANEOUS) ×2 IMPLANT
TUBING CIL FLEX 10 FLL-RA (TUBING) ×2 IMPLANT
WIRE ASAHI PROWATER 180CM (WIRE) ×1 IMPLANT
WIRE HI TORQ VERSACORE-J 145CM (WIRE) ×1 IMPLANT

## 2020-05-20 NOTE — Progress Notes (Signed)
  Echocardiogram 2D Echocardiogram has been performed.  Pieter Partridge 05/20/2020, 8:26 AM

## 2020-05-20 NOTE — Interval H&P Note (Signed)
Cath Lab Visit (complete for each Cath Lab visit)  Clinical Evaluation Leading to the Procedure:   ACS: Yes.    Non-ACS:    Anginal Classification: CCS II  Anti-ischemic medical therapy: No Therapy  Non-Invasive Test Results: No non-invasive testing performed  Prior CABG: No previous CABG      History and Physical Interval Note:  05/20/2020 2:05 PM  Erik Armstrong  has presented today for surgery, with the diagnosis of NSTEMI.  The various methods of treatment have been discussed with the patient and family. After consideration of risks, benefits and other options for treatment, the patient has consented to  Procedure(s): LEFT HEART CATH AND CORONARY ANGIOGRAPHY (N/A) as a surgical intervention.  The patient's history has been reviewed, patient examined, no change in status, stable for surgery.  I have reviewed the patient's chart and labs.  Questions were answered to the patient's satisfaction.     Nanetta Batty

## 2020-05-20 NOTE — Progress Notes (Addendum)
Progress Note  Patient Name: Erik Armstrong Date of Encounter: 05/20/2020  [facility] Cardiologist: No primary care provider on file.   Subjective   Doing well this morning. No chest pain.   Inpatient Medications    Scheduled Meds: . aspirin EC  81 mg Oral Daily  . atorvastatin  80 mg Oral Daily  . carvedilol  6.25 mg Oral BID WC  . sodium chloride flush  3 mL Intravenous Q12H   Continuous Infusions: . sodium chloride    . sodium chloride 1 mL/kg/hr (05/20/20 0455)  . heparin 2,100 Units/hr (05/20/20 0552)  . nitroGLYCERIN 30 mcg/min (05/19/20 1275)   PRN Meds: sodium chloride, acetaminophen, influenza vac split quadrivalent PF, ondansetron (ZOFRAN) IV, sodium chloride flush   Vital Signs    Vitals:   05/20/20 0012 05/20/20 0112 05/20/20 0515 05/20/20 0757  BP: (!) 119/54 (!) 125/45 (!) 144/70 136/73  Pulse:   77   Resp:   20   Temp:   98.7 F (37.1 C)   TempSrc:   Oral   SpO2:   99%   Weight:   106.7 kg   Height:        Intake/Output Summary (Last 24 hours) at 05/20/2020 0830 Last data filed at 05/20/2020 0500 Gross per 24 hour  Intake --  Output 1450 ml  Net -1450 ml   Last 3 Weights 05/20/2020 05/19/2020 05/18/2020  Weight (lbs) 235 lb 4.8 oz 234 lb 6.4 oz 247 lb  Weight (kg) 106.731 kg 106.323 kg 112.038 kg      Telemetry    SR - Personally Reviewed  ECG    No new tracing this morning  Physical Exam  Pleasant male GEN: No acute distress.   Neck: No JVD Cardiac: RRR, soft systolic murmur, no rubs, or gallops.  Respiratory: Clear to auscultation bilaterally. GI: Soft, nontender, non-distended  MS: No edema; No deformity. Neuro:  Nonfocal  Psych: Normal affect   Labs    High Sensitivity Troponin:   Recent Labs  Lab 05/18/20 2229 05/19/20 0002 05/19/20 0345  TROPONINIHS 384* 631* 1,130*      Chemistry Recent Labs  Lab 05/18/20 2229 05/19/20 0345 05/20/20 0455  NA 138 137 135  K 4.5 4.3 4.8  CL 105 104 106  CO2 20* 21* 22   GLUCOSE 94 93 103*  BUN 17 17 18   CREATININE 1.05 1.00 1.02  CALCIUM 8.8* 8.7* 8.6*  PROT  --  6.7  --   ALBUMIN  --  3.4*  --   AST  --  29  --   ALT  --  17  --   ALKPHOS  --  100  --   BILITOT  --  0.6  --   GFRNONAA >60 >60 >60  ANIONGAP 13 12 7      Hematology Recent Labs  Lab 05/18/20 2229 05/20/20 0455  WBC 13.1* 12.4*  RBC 4.83 4.67  HGB 14.7 14.2  HCT 45.0 43.2  MCV 93.2 92.5  MCH 30.4 30.4  MCHC 32.7 32.9  RDW 13.3 13.5  PLT 249 215    BNP Recent Labs  Lab 05/19/20 0355  BNP 63.0     DDimer No results for input(s): DDIMER in the last 168 hours.   Radiology    DG Chest 2 View  Result Date: 05/18/2020 CLINICAL DATA:  Chest pain. EXAM: CHEST - 2 VIEW COMPARISON:  None. FINDINGS: Heart is normal in size.The cardiomediastinal contours are normal. There is peribronchial thickening. Increased AP  diameter of the thorax. Pulmonary vasculature is normal. No consolidation, pleural effusion, or pneumothorax. Slight flattening of lower thoracic vertebra, likely degenerative. No acute osseous abnormalities are seen. IMPRESSION: Peribronchial thickening suggesting may be related to smoking, bronchitis or asthma. Electronically Signed   By: Narda Rutherford M.D.   On: 05/18/2020 22:46    Cardiac Studies   Echo: 05/20/20  IMPRESSIONS    1. Left ventricular ejection fraction, by estimation, is 60 to 65%. The  left ventricle has normal function. The left ventricle has no regional  wall motion abnormalities. There is mild left ventricular hypertrophy.  Left ventricular diastolic parameters  are consistent with Grade II diastolic dysfunction (pseudonormalization).  Elevated left ventricular end-diastolic pressure.  2. Right ventricular systolic function is normal. The right ventricular  size is normal.  3. Left atrial size was mildly dilated.  4. The mitral valve is normal in structure. Trivial mitral valve  regurgitation. No evidence of mitral stenosis.  5.  The aortic valve was not well visualized. There is moderate  calcification of the aortic valve. There is moderate thickening of the  aortic valve. Aortic valve regurgitation is mild. Mild aortic valve  stenosis.  6. The inferior vena cava is normal in size with greater than 50%  respiratory variability, suggesting right atrial pressure of 3 mmHg.   Patient Profile     59 y.o. male with a history of untreated hypertension, tobacco abuse, LDL 149, and no regular medical follow-up presenting with recent onset chest discomfort and evidence of NSTEMI as well as hypertensive urgency.  Assessment & Plan    1. NSTEMI: hsTn peaked at 1130. No chest pain overnight. Planned for cardiac cath today. Echo with normal EF and no rWMA noted, G2DD. The patient understands that risks included but are not limited to stroke (1 in 1000), death (1 in 1000), kidney failure [usually temporary] (1 in 500), bleeding (1 in 200), allergic reaction [possibly serious] (1 in 200).  -- remains on IV heparin and nitro, along with ASA, statin and BB  2. HLD: LDL 149, placed on statin on admission -- FLP/LFTs in 8 weeks  3. HTN: no meds PTA. Improved with addition of coreg.  -- further titrate coreg to 12.5mg  BID, consider addition of ACE/ARB post cath  4. Tobacco use: cessation education this admission  For questions or updates, please contact [facility] Please consult [email or web address] for contact info under        Signed, Laverda Page, NP  05/20/2020, 8:30 AM      Patient seen and examined. Agree with assessment and plan.  No recurrent chest pain on heparin and IV nitroglycerin.  Heart rate continues to be in the 70s, beta-blocker dose has been increased today.  Physical exam also reveals a 1/6 to 2/6 systolic murmur in the aortic position consistent with his aortic valve calcification and mild aortic stenosis.  Troponin elevation consistent with non-ST segment elevation MI.  Ectopy has improved with  beta-blocker initiation.  I discussed the cardiac catheterization procedure in detail with the patient including risk/benefits.  We will most likely add low-dose ARB or ACE inhibition tomorrow.  Aggressive lipid-lowering therapy, now on atorvastatin initiated on admission.  I discussed the importance of complete smoking cessation.  Cardiac catheterization is scheduled for this afternoon.   Lennette Bihari, MD, [facility] [place] 05/20/2020 11:50 AM

## 2020-05-20 NOTE — Progress Notes (Signed)
ANTICOAGULATION CONSULT NOTE  Pharmacy Consult for Heparin Indication: chest pain/ACS  No Known Allergies  Patient Measurements: Height: 5\' 9"  (175.3 cm) Weight: 106.7 kg (235 lb 4.8 oz) IBW/kg (Calculated) : 70.7 Heparin Dosing Weight: 95 kg  Vital Signs: Temp: 98.7 F (37.1 C) (11/08 0515) Temp Source: Oral (11/08 0515) BP: 144/70 (11/08 0515) Pulse Rate: 77 (11/08 0515)  Labs: Recent Labs    05/18/20 2229 05/19/20 0002 05/19/20 0345 05/19/20 1044 05/19/20 1649 05/20/20 0455  HGB 14.7  --   --   --   --  14.2  HCT 45.0  --   --   --   --  43.2  PLT 249  --   --   --   --  215  APTT  --  30  --   --   --   --   LABPROT  --  12.7 12.4  --   --   --   INR  --  1.0 1.0  --   --   --   HEPARINUNFRC  --   --   --  <0.10* 0.12* 0.18*  CREATININE 1.05  --  1.00  --   --   --   TROPONINIHS 384* 631* 1,130*  --   --   --     Estimated Creatinine Clearance: 93.4 mL/min (by C-G formula based on SCr of 1 mg/dL).   Assessment: 59 y.o. male with chest pain for heparin  Goal of Therapy:  Heparin level 0.3-0.7 units/ml Monitor platelets by anticoagulation protocol: Yes   Plan:  Heparin 3000 units IV bolus, then increase heparin 2100 units/hr Follow up after cath today    77 05/20/2020,5:40 AM

## 2020-05-20 NOTE — H&P (View-Only) (Signed)
Progress Note  Patient Name: Erik Armstrong Date of Encounter: 05/20/2020  [facility] Cardiologist: No primary care provider on file.   Subjective   Doing well this morning. No chest pain.   Inpatient Medications    Scheduled Meds: . aspirin EC  81 mg Oral Daily  . atorvastatin  80 mg Oral Daily  . carvedilol  6.25 mg Oral BID WC  . sodium chloride flush  3 mL Intravenous Q12H   Continuous Infusions: . sodium chloride    . sodium chloride 1 mL/kg/hr (05/20/20 0455)  . heparin 2,100 Units/hr (05/20/20 0552)  . nitroGLYCERIN 30 mcg/min (05/19/20 1275)   PRN Meds: sodium chloride, acetaminophen, influenza vac split quadrivalent PF, ondansetron (ZOFRAN) IV, sodium chloride flush   Vital Signs    Vitals:   05/20/20 0012 05/20/20 0112 05/20/20 0515 05/20/20 0757  BP: (!) 119/54 (!) 125/45 (!) 144/70 136/73  Pulse:   77   Resp:   20   Temp:   98.7 F (37.1 C)   TempSrc:   Oral   SpO2:   99%   Weight:   106.7 kg   Height:        Intake/Output Summary (Last 24 hours) at 05/20/2020 0830 Last data filed at 05/20/2020 0500 Gross per 24 hour  Intake --  Output 1450 ml  Net -1450 ml   Last 3 Weights 05/20/2020 05/19/2020 05/18/2020  Weight (lbs) 235 lb 4.8 oz 234 lb 6.4 oz 247 lb  Weight (kg) 106.731 kg 106.323 kg 112.038 kg      Telemetry    SR - Personally Reviewed  ECG    No new tracing this morning  Physical Exam  Pleasant male GEN: No acute distress.   Neck: No JVD Cardiac: RRR, soft systolic murmur, no rubs, or gallops.  Respiratory: Clear to auscultation bilaterally. GI: Soft, nontender, non-distended  MS: No edema; No deformity. Neuro:  Nonfocal  Psych: Normal affect   Labs    High Sensitivity Troponin:   Recent Labs  Lab 05/18/20 2229 05/19/20 0002 05/19/20 0345  TROPONINIHS 384* 631* 1,130*      Chemistry Recent Labs  Lab 05/18/20 2229 05/19/20 0345 05/20/20 0455  NA 138 137 135  K 4.5 4.3 4.8  CL 105 104 106  CO2 20* 21* 22   GLUCOSE 94 93 103*  BUN 17 17 18   CREATININE 1.05 1.00 1.02  CALCIUM 8.8* 8.7* 8.6*  PROT  --  6.7  --   ALBUMIN  --  3.4*  --   AST  --  29  --   ALT  --  17  --   ALKPHOS  --  100  --   BILITOT  --  0.6  --   GFRNONAA >60 >60 >60  ANIONGAP 13 12 7      Hematology Recent Labs  Lab 05/18/20 2229 05/20/20 0455  WBC 13.1* 12.4*  RBC 4.83 4.67  HGB 14.7 14.2  HCT 45.0 43.2  MCV 93.2 92.5  MCH 30.4 30.4  MCHC 32.7 32.9  RDW 13.3 13.5  PLT 249 215    BNP Recent Labs  Lab 05/19/20 0355  BNP 63.0     DDimer No results for input(s): DDIMER in the last 168 hours.   Radiology    DG Chest 2 View  Result Date: 05/18/2020 CLINICAL DATA:  Chest pain. EXAM: CHEST - 2 VIEW COMPARISON:  None. FINDINGS: Heart is normal in size.The cardiomediastinal contours are normal. There is peribronchial thickening. Increased AP  diameter of the thorax. Pulmonary vasculature is normal. No consolidation, pleural effusion, or pneumothorax. Slight flattening of lower thoracic vertebra, likely degenerative. No acute osseous abnormalities are seen. IMPRESSION: Peribronchial thickening suggesting may be related to smoking, bronchitis or asthma. Electronically Signed   By: Melanie  Sanford M.D.   On: 05/18/2020 22:46    Cardiac Studies   Echo: 05/20/20  IMPRESSIONS    1. Left ventricular ejection fraction, by estimation, is 60 to 65%. The  left ventricle has normal function. The left ventricle has no regional  wall motion abnormalities. There is mild left ventricular hypertrophy.  Left ventricular diastolic parameters  are consistent with Grade II diastolic dysfunction (pseudonormalization).  Elevated left ventricular end-diastolic pressure.  2. Right ventricular systolic function is normal. The right ventricular  size is normal.  3. Left atrial size was mildly dilated.  4. The mitral valve is normal in structure. Trivial mitral valve  regurgitation. No evidence of mitral stenosis.  5.  The aortic valve was not well visualized. There is moderate  calcification of the aortic valve. There is moderate thickening of the  aortic valve. Aortic valve regurgitation is mild. Mild aortic valve  stenosis.  6. The inferior vena cava is normal in size with greater than 50%  respiratory variability, suggesting right atrial pressure of 3 mmHg.   Patient Profile     59 y.o. male with a history of untreated hypertension, tobacco abuse, LDL 149, and no regular medical follow-up presenting with recent onset chest discomfort and evidence of NSTEMI as well as hypertensive urgency.  Assessment & Plan    1. NSTEMI: hsTn peaked at 1130. No chest pain overnight. Planned for cardiac cath today. Echo with normal EF and no rWMA noted, G2DD. The patient understands that risks included but are not limited to stroke (1 in 1000), death (1 in 1000), kidney failure [usually temporary] (1 in 500), bleeding (1 in 200), allergic reaction [possibly serious] (1 in 200).  -- remains on IV heparin and nitro, along with ASA, statin and BB  2. HLD: LDL 149, placed on statin on admission -- FLP/LFTs in 8 weeks  3. HTN: no meds PTA. Improved with addition of coreg.  -- further titrate coreg to 12.5mg BID, consider addition of ACE/ARB post cath  4. Tobacco use: cessation education this admission  For questions or updates, please contact [facility] Please consult [email or web address] for contact info under        Signed, Lindsay Roberts, NP  05/20/2020, 8:30 AM      Patient seen and examined. Agree with assessment and plan.  No recurrent chest pain on heparin and IV nitroglycerin.  Heart rate continues to be in the 70s, beta-blocker dose has been increased today.  Physical exam also reveals a 1/6 to 2/6 systolic murmur in the aortic position consistent with his aortic valve calcification and mild aortic stenosis.  Troponin elevation consistent with non-ST segment elevation MI.  Ectopy has improved with  beta-blocker initiation.  I discussed the cardiac catheterization procedure in detail with the patient including risk/benefits.  We will most likely add low-dose ARB or ACE inhibition tomorrow.  Aggressive lipid-lowering therapy, now on atorvastatin initiated on admission.  I discussed the importance of complete smoking cessation.  Cardiac catheterization is scheduled for this afternoon.   Reighlyn Elmes A. Jezreel Sisk, MD, FACC 05/20/2020 11:50 AM   

## 2020-05-21 ENCOUNTER — Encounter (HOSPITAL_COMMUNITY): Payer: Self-pay | Admitting: Cardiovascular Disease

## 2020-05-21 ENCOUNTER — Other Ambulatory Visit (HOSPITAL_COMMUNITY): Payer: Self-pay | Admitting: Cardiology

## 2020-05-21 DIAGNOSIS — I2510 Atherosclerotic heart disease of native coronary artery without angina pectoris: Secondary | ICD-10-CM

## 2020-05-21 DIAGNOSIS — Z720 Tobacco use: Secondary | ICD-10-CM

## 2020-05-21 DIAGNOSIS — I10 Essential (primary) hypertension: Secondary | ICD-10-CM

## 2020-05-21 DIAGNOSIS — E785 Hyperlipidemia, unspecified: Secondary | ICD-10-CM

## 2020-05-21 LAB — BASIC METABOLIC PANEL
Anion gap: 10 (ref 5–15)
BUN: 14 mg/dL (ref 8–23)
CO2: 23 mmol/L (ref 22–32)
Calcium: 8.8 mg/dL — ABNORMAL LOW (ref 8.9–10.3)
Chloride: 105 mmol/L (ref 98–111)
Creatinine, Ser: 1.15 mg/dL (ref 0.61–1.24)
GFR, Estimated: >60 mL/min (ref >60)
Glucose, Bld: 136 mg/dL — ABNORMAL HIGH (ref 70–99)
Potassium: 4 mmol/L (ref 3.5–5.1)
Sodium: 138 mmol/L (ref 135–145)

## 2020-05-21 LAB — CBC
HCT: 44 % (ref 39.0–52.0)
Hemoglobin: 14.4 g/dL (ref 13.0–17.0)
MCH: 30.7 pg (ref 26.0–34.0)
MCHC: 32.7 g/dL (ref 30.0–36.0)
MCV: 93.8 fL (ref 80.0–100.0)
Platelets: 223 10*3/uL (ref 150–400)
RBC: 4.69 MIL/uL (ref 4.22–5.81)
RDW: 13.6 % (ref 11.5–15.5)
WBC: 9.9 10*3/uL (ref 4.0–10.5)
nRBC: 0 % (ref 0.0–0.2)

## 2020-05-21 MED ORDER — LISINOPRIL 10 MG PO TABS
10.0000 mg | ORAL_TABLET | Freq: Every day | ORAL | Status: DC
  Administered 2020-05-21: 10 mg via ORAL
  Filled 2020-05-21: qty 1

## 2020-05-21 MED ORDER — LISINOPRIL 10 MG PO TABS
10.0000 mg | ORAL_TABLET | Freq: Every day | ORAL | 0 refills | Status: DC
Start: 2020-05-21 — End: 2020-05-21

## 2020-05-21 MED ORDER — TICAGRELOR 90 MG PO TABS
90.0000 mg | ORAL_TABLET | Freq: Two times a day (BID) | ORAL | 0 refills | Status: DC
Start: 2020-05-21 — End: 2020-05-21

## 2020-05-21 MED ORDER — NITROGLYCERIN 0.4 MG SL SUBL: 0.4000 mg | SUBLINGUAL_TABLET | SUBLINGUAL | 2 refills | Status: DC | PRN

## 2020-05-21 MED ORDER — ASPIRIN 81 MG PO TBEC
81.0000 mg | DELAYED_RELEASE_TABLET | Freq: Every day | ORAL | 3 refills | Status: DC
Start: 2020-05-21 — End: 2020-05-21

## 2020-05-21 MED ORDER — CARVEDILOL 12.5 MG PO TABS
12.5000 mg | ORAL_TABLET | Freq: Two times a day (BID) | ORAL | 0 refills | Status: DC
Start: 2020-05-21 — End: 2020-05-21

## 2020-05-21 MED ORDER — ASPIRIN 81 MG PO TBEC
81.0000 mg | DELAYED_RELEASE_TABLET | Freq: Every day | ORAL | 0 refills | Status: DC
Start: 2020-05-21 — End: 2020-05-21

## 2020-05-21 MED ORDER — NITROGLYCERIN 0.4 MG SL SUBL: 0.4000 mg | SUBLINGUAL_TABLET | SUBLINGUAL | 0 refills | Status: DC | PRN

## 2020-05-21 MED ORDER — ATORVASTATIN CALCIUM 80 MG PO TABS
80.0000 mg | ORAL_TABLET | Freq: Every day | ORAL | 3 refills | Status: DC
Start: 2020-05-21 — End: 2020-05-21

## 2020-05-21 MED ORDER — LISINOPRIL 10 MG PO TABS
10.0000 mg | ORAL_TABLET | Freq: Every day | ORAL | 3 refills | Status: DC
Start: 2020-05-21 — End: 2020-05-21

## 2020-05-21 MED ORDER — TICAGRELOR 90 MG PO TABS
90.0000 mg | ORAL_TABLET | Freq: Two times a day (BID) | ORAL | 3 refills | Status: DC
Start: 2020-05-21 — End: 2020-05-21

## 2020-05-21 MED ORDER — CARVEDILOL 12.5 MG PO TABS
12.5000 mg | ORAL_TABLET | Freq: Two times a day (BID) | ORAL | 3 refills | Status: DC
Start: 2020-05-21 — End: 2020-07-16

## 2020-05-21 MED ORDER — ATORVASTATIN CALCIUM 80 MG PO TABS
80.0000 mg | ORAL_TABLET | Freq: Every day | ORAL | 0 refills | Status: DC
Start: 2020-05-21 — End: 2020-05-21

## 2020-05-21 MED FILL — BRILINTA 90 MG TABLET: 90 | 30 days supply | Qty: 60 | Fill #0

## 2020-05-21 MED FILL — ATORVASTATIN CALCIUM 80 MG: 80 | 30 days supply | Qty: 30 | Fill #0

## 2020-05-21 MED FILL — LISINOPRIL 10 MG TABS: 10 | 30 days supply | Qty: 30 | Fill #0

## 2020-05-21 MED FILL — CARVEDILOL 12.5 MG TABLET: 12.5 | 30 days supply | Qty: 60 | Fill #0

## 2020-05-21 MED FILL — NITROGLYCERIN 0.4 MG TAB SL: 0.4 | 7 days supply | Qty: 25 | Fill #0

## 2020-05-21 MED FILL — ASPIRIN LOW DOSE 81 MG TBEC: 81 | 30 days supply | Qty: 30 | Fill #0

## 2020-05-21 NOTE — Discharge Summary (Addendum)
Discharge Summary    Patient ID: Erik Armstrong MRN: [id number]; DOB: 12-02-1960  Admit date: 05/18/2020 Discharge date: 05/21/2020  Primary Care Provider: Patient, No Pcp Per  Primary Cardiologist: Shelva Majestic, MD  Primary Electrophysiologist:  None   Discharge Diagnoses    Principal Problem:   NSTEMI (non-ST elevated myocardial infarction) [facility]) Active Problems:   Hyperlipidemia   Hypertension   Tobacco use   CAD (coronary artery disease), native coronary artery    Diagnostic Studies/Procedures    Cath: 05/20/20   Dist RCA lesion is 40% stenosed.  Mid Cx lesion is 95% stenosed.  A drug-eluting stent was successfully placed using a SYNERGY XD 3.0X16.  Post intervention, there is a 0% residual stenosis.   IMPRESSION: Successful mid AV groove circumflex PCI drug-eluting stenting in the setting of hypertensive urgency and non-STEMI.  His risk factors include hypertension, hyperlipidemia and tobacco abuse.  He will need to be treated with uninterrupted dual antiplatelet therapy for at least 12 months in addition to high-dose statin therapy and medications for hypertension.  We talked about the importance of smoking cessation.  The sheath was removed and a TR band was placed on the right wrist to achieve patent hemostasis.  The patient left the lab in stable condition.  Quay Burow. MD, [facility] 05/20/2020  Diagnostic Dominance: Right  Intervention   Echo: 05/20/20  IMPRESSIONS    1. Left ventricular ejection fraction, by estimation, is 60 to 65%. The  left ventricle has normal function. The left ventricle has no regional  wall motion abnormalities. There is mild left ventricular hypertrophy.  Left ventricular diastolic parameters  are consistent with Grade II diastolic dysfunction (pseudonormalization).  Elevated left ventricular end-diastolic pressure.  2. Right ventricular systolic function is normal. The right ventricular  size is normal.  3. Left atrial  size was mildly dilated.  4. The mitral valve is normal in structure. Trivial mitral valve  regurgitation. No evidence of mitral stenosis.  5. The aortic valve was not well visualized. There is moderate  calcification of the aortic valve. There is moderate thickening of the  aortic valve. Aortic valve regurgitation is mild. Mild aortic valve  stenosis.  6. The inferior vena cava is normal in size with greater than 50%  respiratory variability, suggesting right atrial pressure of 3 mmHg.  _____________   History of Present Illness     Erik Armstrong is a 59 y.o. male with a history of HTN (not on meds) and ongoing tobacco abuse (smokes ~ 0.5 PPD for many year) who started having chest pain earlier in the day on 11/6. Substernal left sided pain that was nonradiating. No other symptoms. Pain had been waxing and waning and went away for a few hours but came back more prominently after 6 pm which prompted him to come in for evaluation.  On arrival to the ED, he was found to have elevated troponin to 300s, some ST depression. Mild leukocytosis. Significantly elevated blood pressure. Noted that he had been diagnosed with HTN for quite a while but lost insurance a while back and has not been on any medications. He was admitted to cardiology service and set up for cardiac cath.    Hospital Course     1. NSTEMI: hsTn peaked at 1130. Underwent cardiac cath noted above with 95% lesion in the mLCx treated with PCI/DESx1. Did have residual disease in the dRCA of 40% to be treated medically. Placed on DAPT with ASA/Brilinta for at least one year. No chest  pain overnight. Worked well with cardiac rehab. Echo with no rWMA noted and normal EF.   2. HLD: LDL 149, placed on high dose atorvastatin on admission -- FLP/LFTs in 8 weeks  3. HTN: no meds PTA. Improved with addition of coreg which was titrated to 12.22m BID, along with lisinopril 165mdaily.   4. Tobacco use: cessation education provided  this admission  Did the patient have an acute coronary syndrome (MI, NSTEMI, STEMI, etc) this admission?:  Yes                               AHA/ACC Clinical Performance & Quality Measures: 1. Aspirin prescribed? - Yes 2. ADP Receptor Inhibitor (Plavix/Clopidogrel, Brilinta/Ticagrelor or Effient/Prasugrel) prescribed (includes medically managed patients)? - Yes 3. Beta Blocker prescribed? - Yes 4. High Intensity Statin (Lipitor 40-801mr Crestor 20-63m3mrescribed? - Yes 5. EF assessed during THIS hospitalization? - Yes 6. For EF <40%, was ACEI/ARB prescribed? - Not Applicable (EF >/= 40%)31% For EF <40%, Aldosterone Antagonist (Spironolactone or Eplerenone) prescribed? - Not Applicable (EF >/= 40%)54% Cardiac Rehab Phase II ordered (including medically managed patients)? - Yes   _____________  Discharge Vitals Blood pressure (!) 155/84, pulse 72, temperature 98.3 F (36.8 C), temperature source Oral, resp. rate 16, height _0  (1.753 m), weight 104 kg, SpO2 96 %.  Filed Weights   05/19/20 1641 05/20/20 0515 05/21/20 0410  Weight: 106.3 kg 106.7 kg 104 kg    Labs & Radiologic Studies    CBC Recent Labs    05/20/20 0455 05/21/20 0451  WBC 12.4* 9.9  HGB 14.2 14.4  HCT 43.2 44.0  MCV 92.5 93.8  PLT 215 223 008asic Metabolic Panel Recent Labs    05/20/20 0455 05/21/20 0451  NA 135 138  K 4.8 4.0  CL 106 105  CO2 22 23  GLUCOSE 103* 136*  BUN 18 14  CREATININE 1.02 1.15  CALCIUM 8.6* 8.8*   Liver Function Tests Recent Labs    05/19/20 0345  AST 29  ALT 17  ALKPHOS 100  BILITOT 0.6  PROT 6.7  ALBUMIN 3.4*   No results for input(s): LIPASE, AMYLASE in the last 72 hours. High Sensitivity Troponin:   Recent Labs  Lab 05/18/20 2229 05/19/20 0002 05/19/20 0345  TROPONINIHS 384* 631* 1,130*    BNP Invalid input(s): POCBNP D-Dimer No results for input(s): DDIMER in the last 72 hours. Hemoglobin A1C Recent Labs    05/19/20 0351  HGBA1C 5.4    Fasting Lipid Panel Recent Labs    05/19/20 0345  CHOL 202*  HDL 29*  LDLCALC 149*  TRIG 120  CHOLHDL 7.0   Thyroid Function Tests Recent Labs    05/19/20 0355  TSH 2.673   _____________  DG Chest 2 View  Result Date: 05/18/2020 CLINICAL DATA:  Chest pain. EXAM: CHEST - 2 VIEW COMPARISON:  None. FINDINGS: Heart is normal in size.The cardiomediastinal contours are normal. There is peribronchial thickening. Increased AP diameter of the thorax. Pulmonary vasculature is normal. No consolidation, pleural effusion, or pneumothorax. Slight flattening of lower thoracic vertebra, likely degenerative. No acute osseous abnormalities are seen. IMPRESSION: Peribronchial thickening suggesting may be related to smoking, bronchitis or asthma. Electronically Signed   By: MelaKeith Rake.   On: 05/18/2020 22:46   CARDIAC CATHETERIZATION  Result Date: 05/20/2020  Dist RCA lesion is 40% stenosed.  Mid Cx lesion is 95% stenosed.  A  drug-eluting stent was successfully placed using a SYNERGY XD 3.0X16.  Post intervention, there is a 0% residual stenosis.  Erik Armstrong is a 59 y.o. male  [id number] LOCATION:  FACILITY: [facility] PHYSICIAN: Quay Burow, M.D. Mar 26, 1961 DATE OF PROCEDURE:  05/20/2020 DATE OF DISCHARGE: CARDIAC CATHETERIZATION / PCI DES LCX History obtained from chart review.59 y.o. male with a history of untreated hypertension, tobacco abuse, LDL 149, and no regular medical follow-up presenting with recent onset chest discomfort and evidence of NSTEMI as well as hypertensive urgency.  His troponins rose to proxy 1000.  His EF was normal by 2D echo.  He presents now for diagnostic coronary angiography. PROCEDURE DESCRIPTION: The patient was brought to the second floor [facility] cath lab in the postabsorptive state. He was premedicated with IV Versed and fentanyl. His right wrist was prepped and shaved in usual sterile fashion. Xylocaine 1% was used for local anesthesia. A 6 French  sheath was inserted into the right radial  artery using standard Seldinger technique. The patient received 5500 units  of heparin intravenously.  A 5 [place] TIG catheter, left Judkins catheter pigtail catheters were used for selective coronary angiography and obtaining left heart pressures.  Isovue dye was used for the entirety of the case.  Retrograde aorta, ventricular and pullback pressures were recorded.  Radial cocktail was administered via the SideArm sheath. The patient received 180 mg of Brilinta p.o. and an additional 6500 units of heparin +2500 additionally as well with an ACT above 250.  Isovue dye was used for the entirety of the intervention.  Retroaortic pressures monitored in the case.  Using a 6 [place] XB 3.5 cm guide catheter along with a 0.14 [place] guidewire and a 2 mm x 12 mm balloon the lesion was crossed and predilated.  A 3 mm x 16 mm long Synergy drug-eluting stent was then carefully positioned across the lesion and deployed at 14 atm (3.1 mm) resulting reduction of a 95% fairly focal mid AV groove circumflex stenosis to 0% residual.  The patient tolerated the procedure procedure well.  There were no hemodynamic or electrocardiographic sequelae.   Successful mid AV groove circumflex PCI drug-eluting stenting in the setting of hypertensive urgency and non-STEMI.  His risk factors include hypertension, hyperlipidemia and tobacco abuse.  He will need to be treated with uninterrupted dual antiplatelet therapy for at least 12 months in addition to high-dose statin therapy and medications for hypertension.  We talked about the importance of smoking cessation.  The sheath was removed and a TR band was placed on the right wrist to achieve patent hemostasis.  The patient left the lab in stable condition. Quay Burow. MD, [facility] 05/20/2020 3:25 PM   ECHOCARDIOGRAM COMPLETE  Result Date: 05/20/2020    ECHOCARDIOGRAM REPORT   Patient Name:   Erik Armstrong Date of Exam: 05/20/2020 Medical Rec #:   [id number]       Height:       69.0 in Accession #:    [id number]      Weight:       235.3 lb Date of Birth:  1960/08/15       BSA:          2.214 m Patient Age:    7 years        BP:           136/73 mmHg Patient Gender: M               HR:  86 bpm. Exam Location:  Inpatient Procedure: 2D Echo, Cardiac Doppler and Color Doppler Indications:    NSTEMI  History:        Patient has no prior history of Echocardiogram examinations.                 Acute MI, Signs/Symptoms:Chest Pain; Risk Factors:Current                 Smoker.  Sonographer:    Dustin Flock Referring Phys: [id number] [place]  1. Left ventricular ejection fraction, by estimation, is 60 to 65%. The left ventricle has normal function. The left ventricle has no regional wall motion abnormalities. There is mild left ventricular hypertrophy. Left ventricular diastolic parameters are consistent with Grade II diastolic dysfunction (pseudonormalization). Elevated left ventricular end-diastolic pressure.  2. Right ventricular systolic function is normal. The right ventricular size is normal.  3. Left atrial size was mildly dilated.  4. The mitral valve is normal in structure. Trivial mitral valve regurgitation. No evidence of mitral stenosis.  5. The aortic valve was not well visualized. There is moderate calcification of the aortic valve. There is moderate thickening of the aortic valve. Aortic valve regurgitation is mild. Mild aortic valve stenosis.  6. The inferior vena cava is normal in size with greater than 50% respiratory variability, suggesting right atrial pressure of 3 mmHg. FINDINGS  Left Ventricle: Left ventricular ejection fraction, by estimation, is 60 to 65%. The left ventricle has normal function. The left ventricle has no regional wall motion abnormalities. The left ventricular internal cavity size was normal in size. There is  mild left ventricular hypertrophy. Left ventricular diastolic parameters are  consistent with Grade II diastolic dysfunction (pseudonormalization). Elevated left ventricular end-diastolic pressure. Right Ventricle: The right ventricular size is normal. No increase in right ventricular wall thickness. Right ventricular systolic function is normal. Left Atrium: Left atrial size was mildly dilated. Right Atrium: Right atrial size was normal in size. Pericardium: There is no evidence of pericardial effusion. Mitral Valve: The mitral valve is normal in structure. There is mild thickening of the mitral valve leaflet(s). There is mild calcification of the mitral valve leaflet(s). Mild mitral annular calcification. Trivial mitral valve regurgitation. No evidence  of mitral valve stenosis. Tricuspid Valve: The tricuspid valve is normal in structure. Tricuspid valve regurgitation is not demonstrated. No evidence of tricuspid stenosis. Aortic Valve: The aortic valve was not well visualized. There is moderate calcification of the aortic valve. There is moderate thickening of the aortic valve. There is moderate aortic valve annular calcification. Aortic valve regurgitation is mild. Mild aortic stenosis is present. Aortic valve mean gradient measures 12.0 mmHg. Aortic valve peak gradient measures 17.8 mmHg. Aortic valve area, by VTI measures 1.75 cm. Pulmonic Valve: The pulmonic valve was normal in structure. Pulmonic valve regurgitation is not visualized. No evidence of pulmonic stenosis. Aorta: The aortic root is normal in size and structure. Venous: The inferior vena cava is normal in size with greater than 50% respiratory variability, suggesting right atrial pressure of 3 mmHg. IAS/Shunts: No atrial level shunt detected by color flow Doppler.  LEFT VENTRICLE PLAX 2D LVIDd:         5.10 cm  Diastology LVIDs:         3.30 cm  LV e' medial:    4.03 cm/s LV PW:         1.40 cm  LV E/e' medial:  18.7 LV IVS:        1.40  cm  LV e' lateral:   5.00 cm/s LVOT diam:     2.30 cm  LV E/e' lateral: 15.1 LV SV:          79 LV SV Index:   35 LVOT Area:     4.15 cm  RIGHT VENTRICLE RV Basal diam:  2.50 cm RV S prime:     14.40 cm/s TAPSE (M-mode): 3.8 cm LEFT ATRIUM             Index       RIGHT ATRIUM           Index LA diam:        4.30 cm 1.94 cm/m  RA Area:     15.20 cm LA Vol (A2C):   63.1 ml 28.50 ml/m RA Volume:   36.20 ml  16.35 ml/m LA Vol (A4C):   66.4 ml 29.99 ml/m LA Biplane Vol: 68.9 ml 31.12 ml/m  AORTIC VALVE AV Area (Vmax):    1.90 cm AV Area (Vmean):   1.64 cm AV Area (VTI):     1.75 cm AV Vmax:           211.00 cm/s AV Vmean:          162.000 cm/s AV VTI:            0.449 m AV Peak Grad:      17.8 mmHg AV Mean Grad:      12.0 mmHg LVOT Vmax:         96.60 cm/s LVOT Vmean:        64.000 cm/s LVOT VTI:          0.189 m LVOT/AV VTI ratio: 0.42  AORTA Ao Root diam: 3.20 cm MITRAL VALVE MV Area (PHT): 3.91 cm     SHUNTS MV Decel Time: 194 msec     Systemic VTI:  0.19 m MV E velocity: 75.40 cm/s   Systemic Diam: 2.30 cm MV A velocity: 109.00 cm/s MV E/A ratio:  0.69 Jenkins Rouge MD Electronically signed by Jenkins Rouge MD Signature Date/Time: 05/20/2020/8:40:24 AM    Final    Disposition   Pt is being discharged home today in good condition.  Follow-up Plans & Appointments     Follow-up Information    Burtis Junes, NP Follow up on 06/04/2020.   Specialties: Nurse Practitioner, Interventional Cardiology, Cardiology, Radiology Why: at 3:15pm for your follow up appt.  Contact information: [place]. [address] [place] [address] [phone]              Discharge Instructions    AMB Referral to Cardiac Rehabilitation - Phase II   Complete by: As directed    Diagnosis: NSTEMI   After initial evaluation and assessments completed: Virtual Based Care may be provided alone or in conjunction with Phase 2 Cardiac Rehab based on patient barriers.: Yes   Call MD for:  redness, tenderness, or signs of infection (pain, swelling, redness, odor or green/yellow discharge around  incision site)   Complete by: As directed    Diet - low sodium heart healthy   Complete by: As directed    Discharge instructions   Complete by: As directed    Radial Site Care Refer to this sheet in the next few weeks. These instructions provide you with information on caring for yourself after your procedure. Your caregiver may also give you more specific instructions. Your treatment has been planned according to current medical practices, but problems sometimes occur. Call your  caregiver if you have any problems or questions after your procedure. HOME CARE INSTRUCTIONS You may shower the day after the procedure.Remove the bandage (dressing) and gently wash the site with plain soap and water.Gently pat the site dry.  Do not apply powder or lotion to the site.  Do not submerge the affected site in water for 3 to 5 days.  Inspect the site at least twice daily.  Do not flex or bend the affected arm for 24 hours.  No lifting over 5 pounds (2.3 kg) for 5 days after your procedure.  Do not drive home if you are discharged the same day of the procedure. Have someone else drive you.  You may drive 24 hours after the procedure unless otherwise instructed by your caregiver.  What to expect: Any bruising will usually fade within 1 to 2 weeks.  Blood that collects in the tissue (hematoma) may be painful to the touch. It should usually decrease in size and tenderness within 1 to 2 weeks.  SEEK IMMEDIATE MEDICAL CARE IF: You have unusual pain at the radial site.  You have redness, warmth, swelling, or pain at the radial site.  You have drainage (other than a small amount of blood on the dressing).  You have chills.  You have a fever or persistent symptoms for more than 72 hours.  You have a fever and your symptoms suddenly get worse.  Your arm becomes pale, cool, tingly, or numb.  You have heavy bleeding from the site. Hold pressure on the site.   PLEASE DO NOT MISS ANY DOSES OF YOUR  BRILINTA/!!!!! Also keep a log of you blood pressures and bring back to your follow up appt. Please call the office with any questions.   Patients taking blood thinners should generally stay away from medicines like ibuprofen, Advil, Motrin, naproxen, and Aleve due to risk of stomach bleeding. You may take Tylenol as directed or talk to your primary doctor about alternatives.  PLEASE ENSURE THAT YOU DO NOT RUN OUT OF YOUR BRILINTA!! IF COST IS AN ISSUE PLEASE CALL THE OFFICE TO ADDRESS PRIOR TO RUNNING OUT OF MEDICATION   Increase activity slowly   Complete by: As directed       Discharge Medications   Allergies as of 05/21/2020   No Known Allergies     Medication List    TAKE these medications   aspirin 81 MG EC tablet Take 1 tablet (81 mg total) by mouth daily. Swallow whole.   atorvastatin 80 MG tablet Commonly known as: LIPITOR Take 1 tablet (80 mg total) by mouth daily.   carvedilol 12.5 MG tablet Commonly known as: COREG Take 1 tablet (12.5 mg total) by mouth 2 (two) times daily with a meal.   lisinopril 10 MG tablet Commonly known as: ZESTRIL Take 1 tablet (10 mg total) by mouth daily.   nitroGLYCERIN 0.4 MG SL tablet Commonly known as: Nitrostat Place 1 tablet (0.4 mg total) under the tongue every 5 (five) minutes as needed for chest pain.   ticagrelor 90 MG Tabs tablet Commonly known as: BRILINTA Take 1 tablet (90 mg total) by mouth 2 (two) times daily.        Outstanding Labs/Studies   FLP/LFTs in 8 weeks, BMET at follow up  Duration of Discharge Encounter   Greater than 30 minutes including physician time.  Signed, Reino Bellis, NP 05/21/2020, 10:30 AM    Patient seen and examined. Agree with assessment and plan. No recurrent chest pain status post  successful PCI with DES stenting to his left circumflex yesterday. Now on aspirin and Brilinta for DAPT for minimum of 1 year. Also on carvedilol and lisinopril for hypertension, atorvastatin 80 mg  added with aim for LDL less than 70. He is ambulating well with cardiac rehab this morning. Plan discharge today. Patient lives in [place]. He prefers follow-up evaluation in [place].   Troy Sine, MD, [facility] 05/21/2020 10:30 AM

## 2020-05-21 NOTE — Care Management (Signed)
5110 05-21-20 Case Manager submitted benefits check for Brilinta. Case Manager will follow for cost and provide co pay card. Gala Lewandowsky, RN,BSN Case Manager

## 2020-05-21 NOTE — Progress Notes (Signed)
.  CARDIAC REHAB PHASE I   PRE:  Rate/Rhythm: 76 SR    BP: sitting 155/84    SaO2:   MODE:  Ambulation: 430 ft   POST:  Rate/Rhythm: 87 SR    BP: sitting 178/79     SaO2:   Tolerated well, no c/o with quick pace. BP elevated, just got meds. Discussed MI, stent, restrictions, Brilinta importance, diet, smoking cessation, exercise, NTG and CRPII. Pt and family receptive.  He is thinking of quitting smoking but does not seem quite ready. Gave resources. Will refer to [facility] cRPII however pt is not interested at this time due to work. He does not do virtual apps either.  [id number]  Harriet Masson CES, ACSM 05/21/2020 10:37 AM

## 2020-05-21 NOTE — TOC Benefit Eligibility Note (Signed)
Transition of Care [facility]) Benefit Eligibility Note    Patient Details  Name: Erik Armstrong MRN: [id number] Date of Birth: 1961/07/05   Medication/Dose: BRILINTA   90 MG BID  Covered?: Yes  Tier:  (TIER- PREFERRED)  Prescription Coverage [facility]erred [facility]: [facility]  and  [facility]  Spoke with Person/Company/Phone Number:: GLORIA    @ [employer]  RX # [phone]  Co-Pay: $ 379.32  Prior Approval: No  Deductible: Unmet (OUT-OF-POCKET : UNMET)  Additional Notes: ALTERNATIVE : CLOPIDOGREL 75 MG  BID  OR  DAILY : COVER-YES CO-PAY- ZERO DOLLARS  TIER- NO  P/A- NO    Mardene Sayer Phone Number: 05/21/2020, 11:39 AM

## 2020-05-21 NOTE — Discharge Instructions (Signed)
Information about your medication: Brilinta (anti-platelet agent)  Generic Name (Brand): ticagrelor (Brilinta), twice daily medication  PURPOSE: You are taking this medication along with aspirin to lower your chance of having a heart attack, stroke, or blood clots in your heart stent. These can be fatal. Brilinta and aspirin help prevent platelets from sticking together and forming a clot that can block an artery or your stent.   Common SIDE EFFECTS you may experience include: bruising or bleeding more easily, shortness of breath  Do not stop taking BRILINTA without talking to the doctor who prescribes it for you. People who are treated with a stent and stop taking Brilinta too soon, have a higher risk of getting a blood clot in the stent, having a heart attack, or dying. If you stop Brilinta because of bleeding, or for other reasons, your risk of a heart attack or stroke may increase.   Avoid taking NSAID agents or anti-inflammatory medications such as ibuprofen, naproxen given increased bleed risk with plavix - can use acetaminophen (Tylenol) if needed for pain.  Tell all of your doctors and dentists that you are taking Brilinta. They should talk to the doctor who prescribed Brilinta for you before you have any surgery or invasive procedure.   Contact your health care provider if you experience: severe or uncontrollable bleeding, pink/red/brown urine, vomiting blood or vomit that looks like "coffee grounds", red or black stools (looks like tar), coughing up blood or blood clots ----------------------------------------------------------------------------------------------------------------------   If Brilinta cost after 30 days comes back expensive, let the cardiology office know before close to running out of medication and plan will be to transition from Brilinta to Plavix after 30 days.

## 2020-05-21 NOTE — Progress Notes (Signed)
CARDIOLOGY OFFICE NOTE  Date:  06/04/2020    Erik Armstrong Date of Birth: August 31, 1960 Medical Record #[id number]  PCP:  Patient, No Pcp Per  Cardiologist:  Tresa Endo (NEW)  Chief Complaint  Patient presents with  . Hospitalization Follow-up  . Follow-up    Seen for Dr. Tresa Endo (NEW)    History of Present Illness: Erik Armstrong is a 59 y.o. male who presents today for a post hospital visit. Seen for Dr. Tresa Endo (NEW).   He has a history of HTN (previously not on meds) and ongoing tobacco abuse (smokes ~0.5PPD for many year).   Presented with chest pain earlier this month - came to ER - troponin elevated. BP pretty high. Had lost his insurance - not on medicines. Had cath and PCI with DES to [facility] - EF preserved - does have residual disease - to manage medically.   Comes in today. Here with his wife - she is blind. He feels like he is doing ok. He works [employer]. He has gone back to work - gradually to get "back in the swing". Does not need a note per his report. No more chest pain. Stopped smoking. Has changed his diet. He has no issues that are bothering him. He had some loose stools - used some Imodium - this has improved. He feels like he is doing well. Does not have a BP cuff. Cost of Brilinta may be an issue with his insurance. Will see if we can get some assistance.   Past Medical History:  Diagnosis Date  . Coronary artery disease   . Hyperlipidemia   . Hypertension     Past Surgical History:  Procedure Laterality Date  . CARDIAC CATHETERIZATION    . CORONARY ANGIOPLASTY    . CORONARY STENT INTERVENTION N/A 05/20/2020   Procedure: CORONARY STENT INTERVENTION;  Surgeon: Runell Gess, MD;  [facility];  Service: Cardiovascular;  Laterality: N/A;  . LEFT HEART CATH AND CORONARY ANGIOGRAPHY N/A 05/20/2020   Procedure: LEFT HEART CATH AND CORONARY ANGIOGRAPHY;  Surgeon: Runell Gess, MD;  Location: [facility] CV LAB;  Service: Cardiovascular;   Laterality: N/A;     Medications: Current Meds  Medication Sig  . aspirin 81 MG EC tablet Take 1 tablet (81 mg total) by mouth daily. Swallow whole.  Marland Kitchen atorvastatin (LIPITOR) 80 MG tablet Take 1 tablet (80 mg total) by mouth daily.  . carvedilol (COREG) 12.5 MG tablet Take 1 tablet (12.5 mg total) by mouth 2 (two) times daily with a meal.  . lisinopril (ZESTRIL) 10 MG tablet Take 1 tablet (10 mg total) by mouth daily.  . nitroGLYCERIN (NITROSTAT) 0.4 MG SL tablet Place 1 tablet (0.4 mg total) under the tongue every 5 (five) minutes as needed for chest pain.  . ticagrelor (BRILINTA) 90 MG TABS tablet Take 1 tablet (90 mg total) by mouth 2 (two) times daily.     Allergies: No Known Allergies  Social History: The patient  reports that he has quit smoking. His smoking use included cigarettes. He smoked 0.50 packs per day. He has never used smokeless tobacco. He reports that he does not drink alcohol and does not use drugs.   Family History: The patient's family history includes Heart attack in his mother.   Review of Systems: Please see the history of present illness.   All other systems are reviewed and negative.   Physical Exam: VS:  BP (!) 148/82   Pulse 79  Ht 5\' 9"  (1.753 m)   Wt 236 lb 9.6 oz (107.3 kg)   SpO2 98%   BMI 34.94 kg/m  .  BMI Body mass index is 34.94 kg/m.  Wt Readings from Last 3 Encounters:  06/04/20 236 lb 9.6 oz (107.3 kg)  05/21/20 229 lb 4.8 oz (104 kg)  12/31/14 236 lb (107 kg)   Repeat BP by me is 130/60.   General: Pleasant. Well developed, well nourished and in no acute distress.   HEENT: Normal.  Neck: Supple, no JVD, carotid bruits, or masses noted.  Cardiac: Regular rate and rhythm. He has an outflow murmur of AS noted. No edema.  Respiratory:  Lungs are clear to auscultation bilaterally with normal work of breathing.  GI: Soft and nontender.  MS: No deformity or atrophy. Gait and ROM intact.  Skin: Warm and dry. Color is normal.    Neuro:  Strength and sensation are intact and no gross focal deficits noted.  Psych: Alert, appropriate and with normal affect.   LABORATORY DATA:  EKG:  EKG is ordered today.  Personally reviewed by me. This demonstrates NSR - septal Q's with LVH.  Lab Results  Component Value Date   WBC 9.9 05/21/2020   HGB 14.4 05/21/2020   HCT 44.0 05/21/2020   PLT 223 05/21/2020   GLUCOSE 136 (H) 05/21/2020   CHOL 202 (H) 05/19/2020   TRIG 120 05/19/2020   HDL 29 (L) 05/19/2020   LDLCALC 149 (H) 05/19/2020   ALT 17 05/19/2020   AST 29 05/19/2020   NA 138 05/21/2020   K 4.0 05/21/2020   CL 105 05/21/2020   CREATININE 1.15 05/21/2020   BUN 14 05/21/2020   CO2 23 05/21/2020   TSH 2.673 05/19/2020   INR 1.0 05/19/2020   HGBA1C 5.4 05/19/2020     BNP (last 3 results) Recent Labs    05/19/20 0355  BNP 63.0    ProBNP (last 3 results) No results for input(s): PROBNP in the last 8760 hours.   Other Studies Reviewed Today:  Cath: 05/20/20   Dist RCA lesion is 40% stenosed.  Mid Cx lesion is 95% stenosed.  A drug-eluting stent was successfully placed using a SYNERGY XD 3.0X16.  Post intervention, there is a 0% residual stenosis.  IMPRESSION:Successful mid AV groove circumflex PCI drug-eluting stenting in the setting of hypertensive urgency and non-STEMI. His risk factors include hypertension, hyperlipidemia and tobacco abuse. He will need to be treated with uninterrupted dual antiplatelet therapy for at least 12 months in addition to high-dose statin therapy and medications for hypertension. We talked about the importance of smoking cessation. The sheath was removed and a TR band was placed on the right wrist to achieve patent hemostasis. The patient left the lab in stable condition.  13/8/21. MD, [facility] 05/20/2020    ECHO IMPRESSIONS: 05/20/20  1. Left ventricular ejection fraction, by estimation, is 60 to 65%. The  left ventricle has normal function. The  left ventricle has no regional  wall motion abnormalities. There is mild left ventricular hypertrophy.  Left ventricular diastolic parameters  are consistent with Grade II diastolic dysfunction (pseudonormalization).  Elevated left ventricular end-diastolic pressure.  2. Right ventricular systolic function is normal. The right ventricular  size is normal.  3. Left atrial size was mildly dilated.  4. The mitral valve is normal in structure. Trivial mitral valve  regurgitation. No evidence of mitral stenosis.  5. The aortic valve was not well visualized. There is moderate  calcification of  the aortic valve. There is moderate thickening of the  aortic valve. Aortic valve regurgitation is mild. Mild aortic valve  stenosis.  6. The inferior vena cava is normal in size with greater than 50%  respiratory variability, suggesting right atrial pressure of 3 mmHg.  _____________   Assessment/Plan:  1. NSTEMI - s/p cath with PCI/DES to [facility] -has residual disease - doing well - has stopped smoking - on statin and beta blocker along with DAPT. Will recheck surveillance labs. He is not interested in cardiac rehab. Will see if we can get him some assistance with his Brilinta.  His EF was well preserved.   2. HTN - recheck by me is fine - no changes made.   3. AS - mild by his echo - will need to follow.   4. HLD - on high dose statin.   5. Tobacco abuse - he has stopped - encouraged to continue his abstinence.    Current medicines are reviewed with the patient today.  The patient does not have concerns regarding medicines other than what has been noted above.  The following changes have been made:  See above.  Labs/ tests ordered today include:    Orders Placed This Encounter  Procedures  . EKG 12-Lead     Disposition:   FU with Dr. Tresa Endo in 4 to 6 weeks with fasting labs. He is aware that this is at the [facility] office.    Patient is agreeable to this plan and will call if  any problems develop in the interim.   SignedNorma Fredrickson, NP  06/04/2020 3:32 PM  [facility] [address] [place], [place]  [address] Phone: [phone] Fax: [phone]

## 2020-06-04 ENCOUNTER — Other Ambulatory Visit: Payer: Self-pay

## 2020-06-04 ENCOUNTER — Ambulatory Visit (INDEPENDENT_AMBULATORY_CARE_PROVIDER_SITE_OTHER): Payer: Managed Care, Other (non HMO) | Admitting: Nurse Practitioner

## 2020-06-04 ENCOUNTER — Encounter: Payer: Self-pay | Admitting: Nurse Practitioner

## 2020-06-04 VITALS — BP 148/82 | HR 79 | Ht 69.0 in | Wt 236.6 lb

## 2020-06-04 DIAGNOSIS — E782 Mixed hyperlipidemia: Secondary | ICD-10-CM | POA: Diagnosis not present

## 2020-06-04 DIAGNOSIS — Z87891 Personal history of nicotine dependence: Secondary | ICD-10-CM

## 2020-06-04 DIAGNOSIS — I214 Non-ST elevation (NSTEMI) myocardial infarction: Secondary | ICD-10-CM | POA: Diagnosis not present

## 2020-06-04 DIAGNOSIS — I259 Chronic ischemic heart disease, unspecified: Secondary | ICD-10-CM

## 2020-06-04 DIAGNOSIS — I10 Essential (primary) hypertension: Secondary | ICD-10-CM

## 2020-06-04 NOTE — Patient Instructions (Addendum)
After Visit Summary:  We will be checking the following labs today - BMET and CBC   Medication Instructions:    Continue with your current medicines.    If you need a refill on your cardiac medications before your next appointment, please call your pharmacy.     Testing/Procedures To Be Arranged:  N/A  Follow-Up:   See Dr. Tresa Endo in about 4 to 6 weeks with fasting labs.     At [facility], you and your health needs are our priority.  As part of our continuing mission to provide you with exceptional heart care, we have created designated Provider Care Teams.  These Care Teams include your primary Cardiologist (physician) and Advanced Practice Providers (APPs -  Physician Assistants and Nurse Practitioners) who all work together to provide you with the care you need, when you need it.  Special Instructions:  . Stay safe, wash your hands for at least 20 seconds and wear a mask when needed.  . It was good to talk with you today.  . We will see if we can give you a BP cuff today   Call the [facility] office at [phone] if you have any questions, problems or concerns.

## 2020-06-05 ENCOUNTER — Telehealth: Payer: Self-pay

## 2020-06-05 ENCOUNTER — Telehealth: Payer: Self-pay | Admitting: Licensed Clinical Social Worker

## 2020-06-05 LAB — BASIC METABOLIC PANEL
BUN/Creatinine Ratio: 16 (ref 10–24)
BUN: 17 mg/dL (ref 8–27)
CO2: 20 mmol/L (ref 20–29)
Calcium: 9 mg/dL (ref 8.6–10.2)
Chloride: 103 mmol/L (ref 96–106)
Creatinine, Ser: 1.05 mg/dL (ref 0.76–1.27)
GFR calc Af Amer: 88 mL/min/{1.73_m2} (ref >59)
GFR calc non Af Amer: 76 mL/min/{1.73_m2} (ref >59)
Glucose: 102 mg/dL — ABNORMAL HIGH (ref 65–99)
Potassium: 4.6 mmol/L (ref 3.5–5.2)
Sodium: 138 mmol/L (ref 134–144)

## 2020-06-05 LAB — CBC
Hematocrit: 45.4 % (ref 37.5–51.0)
Hemoglobin: 15.2 g/dL (ref 13.0–17.7)
MCH: 30.8 pg (ref 26.6–33.0)
MCHC: 33.5 g/dL (ref 31.5–35.7)
MCV: 92 fL (ref 79–97)
Platelets: 302 10*3/uL (ref 150–450)
RBC: 4.94 x10E6/uL (ref 4.14–5.80)
RDW: 12.8 % (ref 11.6–15.4)
WBC: 11.3 10*3/uL — ABNORMAL HIGH (ref 3.4–10.8)

## 2020-06-05 NOTE — Telephone Encounter (Signed)
**Note De-Identified Tammra Pressman Obfuscation** No answer and no way to leave a message on the pts home phone so I called his cell phone and was able to s/w the pt. He is at work and cannot write down [place] phone [place] or the required information he will need when he calls them to apply over the phone so per his request I called his cell phone number back and left a VM with [place] phone number, Dr Landry Dyke full name, office address, and fax number.  I did advise in the message for him to contact us at Dr Ellin Goodie office at [phone] if he is advised that he is not eligible for asst through [place].

## 2020-06-05 NOTE — Telephone Encounter (Addendum)
**Note De-identified Erik Armstrong Obfuscation** -----  **Note De-Identified Cicilia Clinger Obfuscation** Message from Rosalio Macadamia, NP sent at 06/04/2020  3:31 PM EST -----  Can we get him some help with Brilinta - he does have commercial insurance.

## 2020-06-05 NOTE — Telephone Encounter (Signed)
CSW referred to assist patient with obtaining a BP cuff. CSW contacted patient to inform cuff will be delivered to home. Patient grateful for support and assistance. CSW available as needed. Jackie Nichoel Digiulio, LCSW, CCSW-MCS [phone]  

## 2020-07-02 NOTE — Progress Notes (Signed)
CARDIOLOGY OFFICE NOTE  Date:  07/16/2020    Erik Armstrong Date of Birth: 06-05-1961 Medical Record #[id number]  PCP:  Patient, No Pcp Per  Cardiologist:  Tresa Endo  Chief Complaint  Patient presents with  . Follow-up    Seen for Dr. Tresa Endo    History of Present Illness: Erik Armstrong is a 59 y.o. male who presents today for a follow up visit. Seen for Dr. Tresa Endo (NEW).    He has a history of HTN (previously not on meds) and ongoing tobacco abuse (smokes ~ 0.5 PPD for many year).    Presented with chest pain back in November - came to ER - troponin elevated. BP pretty high. Had lost his insurance - not on medicines. Had cath and PCI with DES to [facility] - EF preserved - does have residual disease - to manage medically. I saw him in follow up - had stopped smoking, changed diet - felt like he was doing well. Cost of Brilinta may be an issue. We were going to see if we could get him some assistance.   Comes in today. Here alone. He is doing well. No chest pain. Breathing is good. No NTG use but he is keeping it with him. BP had been in the 150's at home. He has not taken his BP meds yet today since he is fasting. Not smoking. Minimal bruising. He has a co-pay card for his Marden Noble - so this should be affordable for him. Weight has gone up.   Past Medical History:  Diagnosis Date  . Coronary artery disease   . Hyperlipidemia   . Hypertension     Past Surgical History:  Procedure Laterality Date  . CARDIAC CATHETERIZATION    . CORONARY ANGIOPLASTY    . CORONARY STENT INTERVENTION N/A 05/20/2020   Procedure: CORONARY STENT INTERVENTION;  Surgeon: Runell Gess, MD;  Location: [facility];  Service: Cardiovascular;  Laterality: N/A;  . LEFT HEART CATH AND CORONARY ANGIOGRAPHY N/A 05/20/2020   Procedure: LEFT HEART CATH AND CORONARY ANGIOGRAPHY;  Surgeon: Runell Gess, MD;  Location: [facility] CV [facility];  Service: Cardiovascular;  Laterality: N/A;      Medications: Current Meds  Medication Sig  . aspirin 81 MG EC tablet Take 1 tablet (81 mg total) by mouth daily. Swallow whole.  Marland Kitchen atorvastatin (LIPITOR) 80 MG tablet Take 1 tablet (80 mg total) by mouth daily.  . carvedilol (COREG) 25 MG tablet Take 1 tablet (25 mg total) by mouth 2 (two) times daily.  Marland Kitchen lisinopril (ZESTRIL) 10 MG tablet Take 1 tablet (10 mg total) by mouth daily.  . nitroGLYCERIN (NITROSTAT) 0.4 MG SL tablet Place 1 tablet (0.4 mg total) under the tongue every 5 (five) minutes as needed for chest pain.  . ticagrelor (BRILINTA) 90 MG TABS tablet Take 1 tablet (90 mg total) by mouth 2 (two) times daily.  . [DISCONTINUED] carvedilol (COREG) 12.5 MG tablet Take 1 tablet (12.5 mg total) by mouth 2 (two) times daily with a meal.     Allergies: No Known Allergies  Social History: The patient  reports that he has quit smoking. His smoking use included cigarettes. He smoked 0.50 packs per day. He has never used smokeless tobacco. He reports that he does not drink alcohol and does not use drugs.   Family History: The patient's family history includes Heart attack in his mother.   Review of Systems: Please see the history of present illness.  All other systems are reviewed and negative.   Physical Exam: VS:  BP (!) 162/90   Pulse 96   Ht 5\' 9"  (1.753 m)   Wt 244 lb (110.7 kg)   SpO2 98%   BMI 36.03 kg/m  .  BMI Body mass index is 36.03 kg/m.  Wt Readings from Last 3 Encounters:  07/16/20 244 lb (110.7 kg)  06/04/20 236 lb 9.6 oz (107.3 kg)  05/21/20 229 lb 4.8 oz (104 kg)   BP is 158/80 by me. This is with the large cuff.   General: Alert and in no acute distress.   Cardiac: Regular rate and rhythm. Soft outflow murmur. No edema.  Respiratory:  Lungs are clear to auscultation bilaterally with normal work of breathing.  GI: Soft and nontender.  MS: No deformity or atrophy. Gait and ROM intact.  Skin: Warm and dry. Color is normal.  Neuro:  Strength and  sensation are intact and no gross focal deficits noted.  Psych: Alert, appropriate and with normal affect.   LABORATORY DATA:  EKG:  EKG is not ordered today.    Lab Results  Component Value Date   WBC 11.3 (H) 06/04/2020   HGB 15.2 06/04/2020   HCT 45.4 06/04/2020   PLT 302 06/04/2020   GLUCOSE 102 (H) 06/04/2020   CHOL 202 (H) 05/19/2020   TRIG 120 05/19/2020   HDL 29 (L) 05/19/2020   LDLCALC 149 (H) 05/19/2020   ALT 17 05/19/2020   AST 29 05/19/2020   NA 138 06/04/2020   K 4.6 06/04/2020   CL 103 06/04/2020   CREATININE 1.05 06/04/2020   BUN 17 06/04/2020   CO2 20 06/04/2020   TSH 2.673 05/19/2020   INR 1.0 05/19/2020   HGBA1C 5.4 05/19/2020     BNP (last 3 results) Recent Labs    05/19/20 0355  BNP 63.0    ProBNP (last 3 results) No results for input(s): PROBNP in the last 8760 hours.   Other Studies Reviewed Today:  Cath: 05/20/20    Dist RCA lesion is 40% stenosed.  Mid Cx lesion is 95% stenosed.  A drug-eluting stent was successfully placed using a SYNERGY XD 3.0X16.  Post intervention, there is a 0% residual stenosis.   IMPRESSION: Successful mid AV groove circumflex PCI drug-eluting stenting in the setting of hypertensive urgency and non-STEMI.  His risk factors include hypertension, hyperlipidemia and tobacco abuse.  He will need to be treated with uninterrupted dual antiplatelet therapy for at least 12 months in addition to high-dose statin therapy and medications for hypertension.  We talked about the importance of smoking cessation.  The sheath was removed and a TR band was placed on the right wrist to achieve patent hemostasis.  The patient left the lab in stable condition.   13/8/21. MD, [facility] 05/20/2020       ECHO IMPRESSIONS: 05/20/20    1. Left ventricular ejection fraction, by estimation, is 60 to 65%. The  left ventricle has normal function. The left ventricle has no regional  wall motion abnormalities. There is mild left  ventricular hypertrophy.  Left ventricular diastolic parameters  are consistent with Grade II diastolic dysfunction (pseudonormalization).  Elevated left ventricular end-diastolic pressure.   2. Right ventricular systolic function is normal. The right ventricular  size is normal.   3. Left atrial size was mildly dilated.   4. The mitral valve is normal in structure. Trivial mitral valve  regurgitation. No evidence of mitral stenosis.   5. The aortic  valve was not well visualized. There is moderate  calcification of the aortic valve. There is moderate thickening of the  aortic valve. Aortic valve regurgitation is mild. Mild aortic valve  stenosis.   6. The inferior vena cava is normal in size with greater than 50%  respiratory variability, suggesting right atrial pressure of 3 mmHg.  _____________     Assessment/Plan:  1. NSTEMI - prior cath with PCI/DEs to the [facility] - has residual disease - he continues to do well - remains on DAPT - on statin and beta blocker as well. Fasting labs today. Was not interested in rehab. Not smoking.   2. AS - will need to be followed.  3. HTN - BP not at goal - no medicines yet today - running high at home as well - HR is elevated - will increase his Coreg to 25 mg BID.  Goal BP 130/80.   4. Obesity - encouraged weight loss.   5. Prior tobacco abuse - not smoking.   6. HLD - on high dose statin - lab today.    Current medicines are reviewed with the patient today.  The patient does not have concerns regarding medicines other than what has been noted above.  The following changes have been made:  See above.  Labs/ tests ordered today include:    Orders Placed This Encounter  Procedures  . Basic metabolic panel  . CBC  . Hepatic function panel  . Lipid panel     Disposition:   FU with Dr. Tresa Endo at the [facility] office as planned.    Patient is agreeable to this plan and will call if any problems develop in the interim.   SignedNorma Fredrickson, NP  07/16/2020 9:31 AM  [facility] [address] [place], [place]  [address] Phone: [phone] Fax: [phone]

## 2020-07-16 ENCOUNTER — Other Ambulatory Visit: Payer: Self-pay

## 2020-07-16 ENCOUNTER — Encounter: Payer: Self-pay | Admitting: Nurse Practitioner

## 2020-07-16 ENCOUNTER — Ambulatory Visit (INDEPENDENT_AMBULATORY_CARE_PROVIDER_SITE_OTHER): Payer: Managed Care, Other (non HMO) | Admitting: Nurse Practitioner

## 2020-07-16 VITALS — BP 162/90 | HR 96 | Ht 69.0 in | Wt 244.0 lb

## 2020-07-16 DIAGNOSIS — I214 Non-ST elevation (NSTEMI) myocardial infarction: Secondary | ICD-10-CM | POA: Diagnosis not present

## 2020-07-16 DIAGNOSIS — I259 Chronic ischemic heart disease, unspecified: Secondary | ICD-10-CM | POA: Diagnosis not present

## 2020-07-16 DIAGNOSIS — E782 Mixed hyperlipidemia: Secondary | ICD-10-CM | POA: Diagnosis not present

## 2020-07-16 DIAGNOSIS — I10 Essential (primary) hypertension: Secondary | ICD-10-CM | POA: Diagnosis not present

## 2020-07-16 LAB — HEPATIC FUNCTION PANEL
ALT: 22 IU/L (ref 0–44)
AST: 20 IU/L (ref 0–40)
Albumin: 4.3 g/dL (ref 3.8–4.8)
Alkaline Phosphatase: 163 IU/L — ABNORMAL HIGH (ref 44–121)
Bilirubin Total: 0.4 mg/dL (ref 0.0–1.2)
Bilirubin, Direct: 0.12 mg/dL (ref 0.00–0.40)
Total Protein: 7.4 g/dL (ref 6.0–8.5)

## 2020-07-16 LAB — LIPID PANEL
Chol/HDL Ratio: 4.7 ratio (ref 0.0–5.0)
Cholesterol, Total: 146 mg/dL (ref 100–199)
HDL: 31 mg/dL — ABNORMAL LOW (ref >39)
LDL Chol Calc (NIH): 92 mg/dL (ref 0–99)
Triglycerides: 126 mg/dL (ref 0–149)
VLDL Cholesterol Cal: 23 mg/dL (ref 5–40)

## 2020-07-16 LAB — BASIC METABOLIC PANEL
BUN/Creatinine Ratio: 13 (ref 10–24)
BUN: 15 mg/dL (ref 8–27)
CO2: 18 mmol/L — ABNORMAL LOW (ref 20–29)
Calcium: 9 mg/dL (ref 8.6–10.2)
Chloride: 103 mmol/L (ref 96–106)
Creatinine, Ser: 1.15 mg/dL (ref 0.76–1.27)
GFR calc Af Amer: 79 mL/min/{1.73_m2} (ref >59)
GFR calc non Af Amer: 68 mL/min/{1.73_m2} (ref >59)
Glucose: 106 mg/dL — ABNORMAL HIGH (ref 65–99)
Potassium: 4.8 mmol/L (ref 3.5–5.2)
Sodium: 137 mmol/L (ref 134–144)

## 2020-07-16 LAB — CBC
Hematocrit: 44.9 % (ref 37.5–51.0)
Hemoglobin: 14.9 g/dL (ref 13.0–17.7)
MCH: 30.3 pg (ref 26.6–33.0)
MCHC: 33.2 g/dL (ref 31.5–35.7)
MCV: 91 fL (ref 79–97)
Platelets: 265 10*3/uL (ref 150–450)
RBC: 4.91 x10E6/uL (ref 4.14–5.80)
RDW: 13.1 % (ref 11.6–15.4)
WBC: 9.9 10*3/uL (ref 3.4–10.8)

## 2020-07-16 MED ORDER — CARVEDILOL 25 MG PO TABS: 25.0000 mg | ORAL_TABLET | Freq: Two times a day (BID) | ORAL | 3 refills | Status: AC

## 2020-07-16 NOTE — Patient Instructions (Addendum)
After Visit Summary:  We will be checking the following labs today - BMET, CBC, Lipids and LFTs   Medication Instructions:    Continue with your current medicines. BUT  I am going to increase the Coreg to 25 mg twice a day - you can take 2 of your 12.5 mg tablets twice a day and use those up - I have sent the 25 mg RX to your pharmacy.    If you need a refill on your cardiac medications before your next appointment, please call your pharmacy.     Testing/Procedures To Be Arranged:  N/A  Follow-Up:   See Dr. Tresa Endo as planned in April. This will be at the [facility].     At [facility], you and your health needs are our priority.  As part of our continuing mission to provide you with exceptional heart care, we have created designated Provider Care Teams.  These Care Teams include your primary Cardiologist (physician) and Advanced Practice Providers (APPs -  Physician Assistants and Nurse Practitioners) who all work together to provide you with the care you need, when you need it.  Special Instructions:  . Stay safe, wash your hands for at least 20 seconds and wear a mask when needed.  . It was good to talk with you today.  Marland Kitchen Keep a check on your BP for [place] - goal is to get to 130/80 most of the time.  Otto Herb for not smoking!!!   Call the [facility] office at [phone] if you have any questions, problems or concerns.

## 2020-07-17 ENCOUNTER — Other Ambulatory Visit: Payer: Self-pay | Admitting: *Deleted

## 2020-07-17 DIAGNOSIS — I214 Non-ST elevation (NSTEMI) myocardial infarction: Secondary | ICD-10-CM

## 2020-07-17 DIAGNOSIS — E782 Mixed hyperlipidemia: Secondary | ICD-10-CM

## 2020-07-17 MED ORDER — EZETIMIBE 10 MG PO TABS: 10.0000 mg | ORAL_TABLET | Freq: Every day | ORAL | 3 refills | Status: AC

## 2020-08-20 ENCOUNTER — Other Ambulatory Visit: Payer: Self-pay

## 2020-08-20 ENCOUNTER — Other Ambulatory Visit: Payer: Managed Care, Other (non HMO)

## 2020-08-20 DIAGNOSIS — E782 Mixed hyperlipidemia: Secondary | ICD-10-CM

## 2020-08-20 DIAGNOSIS — I214 Non-ST elevation (NSTEMI) myocardial infarction: Secondary | ICD-10-CM

## 2020-08-20 LAB — HEPATIC FUNCTION PANEL
ALT: 25 IU/L (ref 0–44)
AST: 23 IU/L (ref 0–40)
Albumin: 4.2 g/dL (ref 3.8–4.8)
Alkaline Phosphatase: 133 IU/L — ABNORMAL HIGH (ref 44–121)
Bilirubin Total: 0.4 mg/dL (ref 0.0–1.2)
Bilirubin, Direct: 0.12 mg/dL (ref 0.00–0.40)
Total Protein: 7.3 g/dL (ref 6.0–8.5)

## 2020-08-20 LAB — LIPID PANEL
Chol/HDL Ratio: 3.9 ratio (ref 0.0–5.0)
Cholesterol, Total: 110 mg/dL (ref 100–199)
HDL: 28 mg/dL — ABNORMAL LOW (ref >39)
LDL Chol Calc (NIH): 63 mg/dL (ref 0–99)
Triglycerides: 99 mg/dL (ref 0–149)
VLDL Cholesterol Cal: 19 mg/dL (ref 5–40)

## 2020-08-23 ENCOUNTER — Telehealth: Payer: Self-pay | Admitting: Nurse Practitioner

## 2020-08-23 NOTE — Telephone Encounter (Signed)
I suspect these went to his primary cardiologist - I have reviewed - LDL is better.  Stay on current regimen. Alk phos is improved.  Copy to his PCP please.  Cecille Rubin

## 2020-08-23 NOTE — Telephone Encounter (Signed)
Patient's wife is calling to discuss lab results.

## 2020-08-23 NOTE — Telephone Encounter (Signed)
Pts wife Erik Armstrong (on dpr) is calling to get pts lab results Erik Armstrong ordered on him to have done on 08/20/20.  Wife states she hasn't heard anything from our office, so wanted to see if those were back. Informed the pts wife that Erik Fredrickson NP has not completely resulted the labs, however a preliminary is available.   Erik Armstrong ordered for the pt to have lipids and LFTs done on from previous results on 07/16/20.  Pt at that time was advised to add zetia 10 mg po daily and have these repeated.  LDL was not at goal at that time.   Pts LDL on this result showed improvement at 63. His alkaline phosphate is still elevated but decreased since 1/4 lab results.  Advised the pts wife to have him continue his regimen, until otherwise advised by Erik Fredrickson NP, when she results his current labs.  Wife verbalized understanding and agrees with this plan. Will route this result to Erik Armstrong and Erik Armstrong CMA to further review and follow-up with the pt and/or wife.

## 2020-08-26 NOTE — Telephone Encounter (Signed)
S/w pt's wife per [facility]) is aware of lab results. Added PCP to chart, copies of all labs routed via epic too PCP.

## 2020-10-14 ENCOUNTER — Encounter: Payer: Self-pay | Admitting: Cardiovascular Disease

## 2020-10-14 ENCOUNTER — Ambulatory Visit (INDEPENDENT_AMBULATORY_CARE_PROVIDER_SITE_OTHER): Payer: Managed Care, Other (non HMO) | Admitting: Cardiovascular Disease

## 2020-10-14 ENCOUNTER — Other Ambulatory Visit: Payer: Self-pay

## 2020-10-14 DIAGNOSIS — E785 Hyperlipidemia, unspecified: Secondary | ICD-10-CM

## 2020-10-14 DIAGNOSIS — I10 Essential (primary) hypertension: Secondary | ICD-10-CM

## 2020-10-14 DIAGNOSIS — I214 Non-ST elevation (NSTEMI) myocardial infarction: Secondary | ICD-10-CM | POA: Diagnosis not present

## 2020-10-14 DIAGNOSIS — E668 Other obesity: Secondary | ICD-10-CM

## 2020-10-14 DIAGNOSIS — Z87891 Personal history of nicotine dependence: Secondary | ICD-10-CM | POA: Diagnosis not present

## 2020-10-14 NOTE — Progress Notes (Signed)
Cardiology Office Note    Date:  10/14/2020   ID:  Erik, Armstrong 1960-12-04, MRN [id number]  PCP:  Carol Ada, MD  Cardiologist:  Shelva Majestic, MD   Initial office with me following his November 2021 hospitalization  History of Present Illness:  Erik Armstrong is a 60 y.o. male who presented to [facility] in early November 2022 with chest pain.  He has a history of hypertension and at that time longstanding tobacco use.  Troponins were positive and consistent with a non-STEMI.  He underwent cardiac catheterization on May 20, 2020 by Dr. Alvester Chou was found to have a 95% circumflex stenosis which was successfully stented.  There was also 40% narrowing in his RCA.  Echo Doppler study revealed an EF of 60 to 65% with mild LVH and grade 2 diastolic dysfunction.  There was mild aortic stenosis with a mean gradient of 12 peak gradient of 17 mmHg.  Subsequent to his hospitalization, he quit tobacco use.  He has been seen on several occasions by Truitt Merle, NP.  He has been on aspirin/Brilinta for DAPT, carvedilol 25 mg twice a day and lisinopril 10 mg daily for hypertension and CAD as well as Zetia 10 mg and atorvastatin 80 mg for hyperlipidemia.  He underwent follow-up laboratory on August 20, 2020 which showed normal LFTs.  LDL cholesterol had improved to 63.  HDL was low at 28.  Triglycerides were 99.  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  He denies any recurrent chest pain.  He is unaware of palpitations.  He denies presyncope or syncope.  He presents for his initial evaluation with me.   Past Medical History:  Diagnosis Date  . Coronary artery disease   . Hyperlipidemia   . Hypertension     Past Surgical History:  Procedure Laterality Date  . CARDIAC CATHETERIZATION    . CORONARY ANGIOPLASTY    . CORONARY STENT INTERVENTION N/A 05/20/2020   Procedure: CORONARY STENT INTERVENTION;  Surgeon: Lorretta Harp, MD;  Location: [place]  [facility];  Service: Cardiovascular;  Laterality: N/A;  . LEFT HEART CATH AND CORONARY ANGIOGRAPHY N/A 05/20/2020   Procedure: LEFT HEART CATH AND CORONARY ANGIOGRAPHY;  Surgeon: Lorretta Harp, MD;  Location: [place] [facility];  Service: Cardiovascular;  Laterality: N/A;    Current Medications: Outpatient Medications Prior to Visit  Medication Sig Dispense Refill  . aspirin 81 MG EC tablet TAKE 1 TABLET (81 MG TOTAL) BY MOUTH DAILY. SWALLOW WHOLE. (Patient taking differently: Take by mouth 5 (five) times daily. SWALLOW WHOLE.) 30 tablet 0  . atorvastatin (LIPITOR) 80 MG tablet TAKE 1 TABLET (80 MG TOTAL) BY MOUTH DAILY. 30 tablet 0  . carvedilol (COREG) 25 MG tablet Take 1 tablet (25 mg total) by mouth 2 (two) times daily. 180 tablet 3  . ezetimibe (ZETIA) 10 MG tablet Take 1 tablet (10 mg total) by mouth daily. 90 tablet 3  . lisinopril (ZESTRIL) 10 MG tablet TAKE 1 TABLET (10 MG TOTAL) BY MOUTH DAILY. 30 tablet 0  . nitroGLYCERIN (NITROSTAT) 0.4 MG SL tablet PLACE 1 TABLET (0.4 MG TOTAL) UNDER THE TONGUE EVERY 5 (FIVE) MINUTES AS NEEDED FOR CHEST PAIN. 25 tablet 0  . ticagrelor (BRILINTA) 90 MG TABS tablet TAKE 1 TABLET (90 MG TOTAL) BY MOUTH 2 (TWO) TIMES DAILY. 60 tablet 0   No facility-administered medications prior to visit.     Allergies:   Patient has no known  allergies.   Social History   Socioeconomic History  . Marital status: Married    Spouse name: Not on file  . Number of children: Not on file  . Years of education: Not on file  . Highest education level: Not on file  Occupational History  . Not on file  Tobacco Use  . Smoking status: Former Smoker    Packs/day: 0.50    Types: Cigarettes  . Smokeless tobacco: Never Used  Vaping Use  . Vaping Use: Never used  Substance and Sexual Activity  . Alcohol use: No    Alcohol/week: 0.0 standard drinks  . Drug use: No  . Sexual activity: Not on file  Other Topics Concern  . Not on file  Social History Narrative  .  Not on file   Social Determinants of Health   Financial Resource Strain: Not on file  Food Insecurity: Not on file  Transportation Needs: Not on file  Physical Activity: Not on file  Stress: Not on file  Social Connections: Not on file     Family History:  The patient's family history includes Heart attack in his mother.   ROS General: Negative; No fevers, chills, or night sweats;  HEENT: Negative; No changes in vision or hearing, sinus congestion, difficulty swallowing Pulmonary: Negative; No cough, wheezing, shortness of breath, hemoptysis Cardiovascular: Negative; No chest pain, presyncope, syncope, palpitations GI: Negative; No nausea, vomiting, diarrhea, or abdominal pain GU: Negative; No dysuria, hematuria, or difficulty voiding Musculoskeletal: Negative; no myalgias, joint pain, or weakness Hematologic/Oncology: Negative; no easy bruising, bleeding Endocrine: Negative; no heat/cold intolerance; no diabetes Neuro: Negative; no changes in balance, headaches Skin: Negative; No rashes or skin lesions Psychiatric: Negative; No behavioral problems, depression Sleep: Negative; No snoring, daytime sleepiness, hypersomnolence, bruxism, restless legs, hypnogognic hallucinations, no cataplexy Other comprehensive 14 point system review is negative.   PHYSICAL EXAM:   VS:  BP (!) 154/78 (BP Location: Left Arm, Patient Position: Sitting)   Pulse 65   Ht '5\' 9"'  (1.753 m)   Wt 245 lb 3.2 oz (111.2 kg)   SpO2 97%   BMI 36.21 kg/m     Repeat blood pressure by me was improved at 128/72.  Wt Readings from Last 3 Encounters:  10/14/20 245 lb 3.2 oz (111.2 kg)  07/16/20 244 lb (110.7 kg)  06/04/20 236 lb 9.6 oz (107.3 kg)    General: Alert, oriented, no distress.  Skin: normal turgor, no rashes, warm and dry HEENT: Normocephalic, atraumatic. Pupils equal round and reactive to light; sclera anicteric; extraocular muscles intact;  Nose without nasal septal hypertrophy Mouth/Parynx  benign; Mallinpatti scale 3 Neck: No JVD, no carotid bruits; normal carotid upstroke Lungs: clear to ausculatation and percussion; no wheezing or rales Chest wall: without tenderness to palpitation Heart: PMI not displaced, RRR, s1 s2 normal, 2/6 systolic murmur in the aortic area, no diastolic murmur, no rubs, gallops, thrills, or heaves Abdomen: soft, nontender; no hepatosplenomehaly, BS+; abdominal aorta nontender and not dilated by palpation. Back: no CVA tenderness Pulses 2+ Musculoskeletal: full range of motion, normal strength, no joint deformities Extremities: no clubbing cyanosis or edema, Homan's sign negative  Neurologic: grossly nonfocal; Cranial nerves grossly wnl Psychologic: Normal mood and affect   Studies/Labs Reviewed:   EKG:  EKG is ordered today.  ECG (independently read by me): Normal sinus rhythm at 65 bpm.  Left axis deviation/left anterior hemiblock.  Nonspecific ST-T waves.  Recent Labs: BMP Latest Ref Rng & Units 07/16/2020 06/04/2020 05/21/2020  Glucose 65 -  99 mg/dL 106(H) 102(H) 136(H)  BUN 8 - 27 mg/dL '15 17 14  ' Creatinine 0.76 - 1.27 mg/dL 1.15 1.05 1.15  BUN/Creat Ratio 10 - '24 13 16 ' -  Sodium 134 - 144 mmol/L 137 138 138  Potassium 3.5 - 5.2 mmol/L 4.8 4.6 4.0  Chloride 96 - 106 mmol/L 103 103 105  CO2 20 - 29 mmol/L 18(L) 20 23  Calcium 8.6 - 10.2 mg/dL 9.0 9.0 8.8(L)     Hepatic Function Latest Ref Rng & Units 08/20/2020 07/16/2020 05/19/2020  Total Protein 6.0 - 8.5 g/dL 7.3 7.4 6.7  Albumin 3.8 - 4.8 g/dL 4.2 4.3 3.4(L)  AST 0 - 40 IU/L '23 20 29  ' ALT 0 - 44 IU/L '25 22 17  ' Alk Phosphatase 44 - 121 IU/L 133(H) 163(H) 100  Total Bilirubin 0.0 - 1.2 mg/dL 0.4 0.4 0.6  Bilirubin, Direct 0.00 - 0.40 mg/dL 0.12 0.12 -    CBC Latest Ref Rng & Units 07/16/2020 06/04/2020 05/21/2020  WBC 3.4 - 10.8 x10E3/uL 9.9 11.3(H) 9.9  Hemoglobin 13.0 - 17.7 g/dL 14.9 15.2 14.4  Hematocrit 37.5 - 51.0 % 44.9 45.4 44.0  Platelets 150 - 450 x10E3/uL 265 302 223   Lab  Results  Component Value Date   MCV 91 07/16/2020   MCV 92 06/04/2020   MCV 93.8 05/21/2020   Lab Results  Component Value Date   TSH 2.673 05/19/2020   Lab Results  Component Value Date   HGBA1C 5.4 05/19/2020     BNP    Component Value Date/Time   BNP 63.0 05/19/2020 0355    ProBNP No results found for: PROBNP   Lipid Panel     Component Value Date/Time   CHOL 110 08/20/2020 0923   TRIG 99 08/20/2020 0923   HDL 28 (L) 08/20/2020 0923   CHOLHDL 3.9 08/20/2020 0923   CHOLHDL 7.0 05/19/2020 0345   VLDL 24 05/19/2020 0345   LDLCALC 63 08/20/2020 0923   LABVLDL 19 08/20/2020 0923     RADIOLOGY: No results found.   Additional studies/ records that were reviewed today include:    CATH/PCI: 05/20/2020 History obtained from chart review.60 y.o.malewith a history of untreated hypertension, tobacco abuse, LDL 149, and no regular medical follow-up presenting with recent onset chest discomfort and evidence of NSTEMI as well as hypertensive urgency.  His troponins rose to proxy 1000.  His EF was normal by 2D echo.  He presents now for diagnostic coronary angiography.   PROCEDURE DESCRIPTION:   The patient was brought to the second floor [facility] cath lab in the postabsorptive state. He was premedicated with IV Versed and fentanyl. His right wrist was prepped and shaved in usual sterile fashion. Xylocaine 1% was used for local anesthesia. A 6 French sheath was inserted into the right radial  artery using standard Seldinger technique. The patient received 5500 units  of heparin intravenously.  A 5 [place] TIG catheter, left Judkins catheter pigtail catheters were used for selective coronary angiography and obtaining left heart pressures.  Isovue dye was used for the entirety of the case.  Retrograde aorta, ventricular and pullback pressures were recorded.  Radial cocktail was administered via the SideArm sheath.  The patient received 180 mg of Brilinta p.o.  and an additional 6500 units of heparin +2500 additionally as well with an ACT above 250.  Isovue dye was used for the entirety of the intervention.  Retroaortic pressures monitored in the case.  Using a 6 [place] XB 3.5 cm  guide catheter along with a 0.14 Prowater guidewire and a 2 mm x 12 mm balloon the lesion was crossed and predilated.  A 3 mm x 16 mm long Synergy drug-eluting stent was then carefully positioned across the lesion and deployed at 14 atm (3.1 mm) resulting reduction of a 95% fairly focal mid AV groove circumflex stenosis to 0% residual.  The patient tolerated the procedure procedure well.  There were no hemodynamic or electrocardiographic sequelae.   IMPRESSION: Successful mid AV groove circumflex PCI drug-eluting stenting in the setting of hypertensive urgency and non-STEMI.  His risk factors include hypertension, hyperlipidemia and tobacco abuse.  He will need to be treated with uninterrupted dual antiplatelet therapy for at least 12 months in addition to high-dose statin therapy and medications for hypertension.  We talked about the importance of smoking cessation.  The sheath was removed and a TR band was placed on the right wrist to achieve patent hemostasis.  The patient left the lab in stable condition.     Intervention     ECHO: 05/20/2020 IMPRESSIONS  1. Left ventricular ejection fraction, by estimation, is 60 to 65%. The  left ventricle has normal function. The left ventricle has no regional  wall motion abnormalities. There is mild left ventricular hypertrophy.  Left ventricular diastolic parameters  are consistent with Grade II diastolic dysfunction (pseudonormalization).  Elevated left ventricular end-diastolic pressure.  2. Right ventricular systolic function is normal. The right ventricular  size is normal.  3. Left atrial size was mildly dilated.  4. The mitral valve is normal in structure. Trivial mitral valve  regurgitation. No evidence of mitral  stenosis.  5. The aortic valve was not well visualized. There is moderate  calcification of the aortic valve. There is moderate thickening of the  aortic valve. Aortic valve regurgitation is mild. Mild aortic valve  stenosis.  6. The inferior vena cava is normal in size with greater than 50%  respiratory variability, suggesting right atrial pressure of 3 mmHg.    ASSESSMENT:    1. Non-ST elevation (NSTEMI) myocardial infarction [facility]): November 2021, DES stent to left circumflex   2.  Essential HTN   3. Hyperlipidemia with target LDL less than 100   4. Former smoker   5. Moderate obesity     PLAN:  1. NSTEMI: He presented to [facility] on May 18, 2020 with chest pain.  Peak troponin 1130.  Catheterization on May 20, 2020 revealed 5% mid AV groove circumflex stenosis which was successfully stented with a 3.0 x 16 mm Synergy stent.  He has mild concomitant CAD involving his RCA.  Presently he continues on DAPT with aspirin/Alimta for minimum of 1 year.  He is not had any recurrent anginal symptomatology on carvedilol 12.5 mg twice daily in addition to lisinopril.  2. Essential hypertension: Prior to his hospital admission he had been off medical therapy.  Currently he is on carvedilol 12.5 mg twice daily and lisinopril 10 mg daily.  Although initial blood pressure on presentation today was mildly elevated repeat blood pressure by me was stable at 128/72.  He will continue current regimen.  3.  Hyperipidemia: He is now on atorvastatin 80 mg daily in addition to Zetia 10 mg daily.  LDL cholesterol in August 20, 2020 and improved to 63.  Plan to continue current therapy.  4.  H/O tobacco use: He previously had a longstanding history.  Following his November presentation, he has quit tobacco.  5.  Moderate obesity: BMI 36.21.  Gust the importance of weight loss and increased exercise.  Ideally 5 days/week for minimum of 30 minutes totaling at least 150 minutes weekly  I will see  him in 6 months for follow-up evaluation or sooner as needed.  Medication Adjustments/Labs and Tests Ordered: Current medicines are reviewed at length with the patient today.  Concerns regarding medicines are outlined above.  Medication changes, Labs and Tests ordered today are listed in the Patient Instructions below. Patient Instructions  Medication Instructions:  The current medical regimen is effective;  continue present plan and medications.  *If you need a refill on your cardiac medications before your next appointment, please call your pharmacy*  Follow-Up: At [facility], you and your health needs are our priority.  As part of our continuing mission to provide you with exceptional heart care, we have created designated Provider Care Teams.  These Care Teams include your primary Cardiologist (physician) and Advanced Practice Providers (APPs -  Physician Assistants and Nurse Practitioners) who all work together to provide you with the care you need, when you need it.  We recommend signing up for the patient portal called "MyChart".  Sign up information is provided on this After Visit Summary.  MyChart is used to connect with patients for Virtual Visits (Telemedicine).  Patients are able to view lab/test results, encounter notes, upcoming appointments, etc.  Non-urgent messages can be sent to your provider as well.   To learn more about what you can do with MyChart, go to [email or web address].    Your next appointment:   7 month(s) (in November)  The format for your next appointment:   In Person  Provider:   You may see Shelva Majestic, MD or one of the following Advanced Practice Providers on your designated Care Team:    Almyra Deforest, PA-C  Fabian Sharp, PA-C or   Roby Lofts, [place]       Signed, Shelva Majestic, MD  10/14/2020 8:37 AM    [facility] [address], [place], [place],   [address] Phone: [phone]

## 2020-10-14 NOTE — Patient Instructions (Signed)
Medication Instructions:  The current medical regimen is effective;  continue present plan and medications.  *If you need a refill on your cardiac medications before your next appointment, please call your pharmacy*  Follow-Up: At [facility], you and your health needs are our priority.  As part of our continuing mission to provide you with exceptional heart care, we have created designated Provider Care Teams.  These Care Teams include your primary Cardiologist (physician) and Advanced Practice Providers (APPs -  Physician Assistants and Nurse Practitioners) who all work together to provide you with the care you need, when you need it.  We recommend signing up for the patient portal called "MyChart".  Sign up information is provided on this After Visit Summary.  MyChart is used to connect with patients for Virtual Visits (Telemedicine).  Patients are able to view lab/test results, encounter notes, upcoming appointments, etc.  Non-urgent messages can be sent to your provider as well.   To learn more about what you can do with MyChart, go to [email or web address].    Your next appointment:   7 month(s) (in November)  The format for your next appointment:   In Person  Provider:   You may see Nicki Guadalajara, MD or one of the following Advanced Practice Providers on your designated Care Team:    Azalee Course, PA-C  Micah Flesher, PA-C or   Judy Pimple, [place]

## 2020-11-11 ENCOUNTER — Other Ambulatory Visit (HOSPITAL_COMMUNITY): Payer: Self-pay

## 2021-11-24 IMAGING — DX DG CHEST 2V
2 series · 2 of 2 positions shown · non-contrast
Comparison: None.

CLINICAL DATA: Chest pain.

EXAM:
CHEST - 2 VIEW

[chest pa]
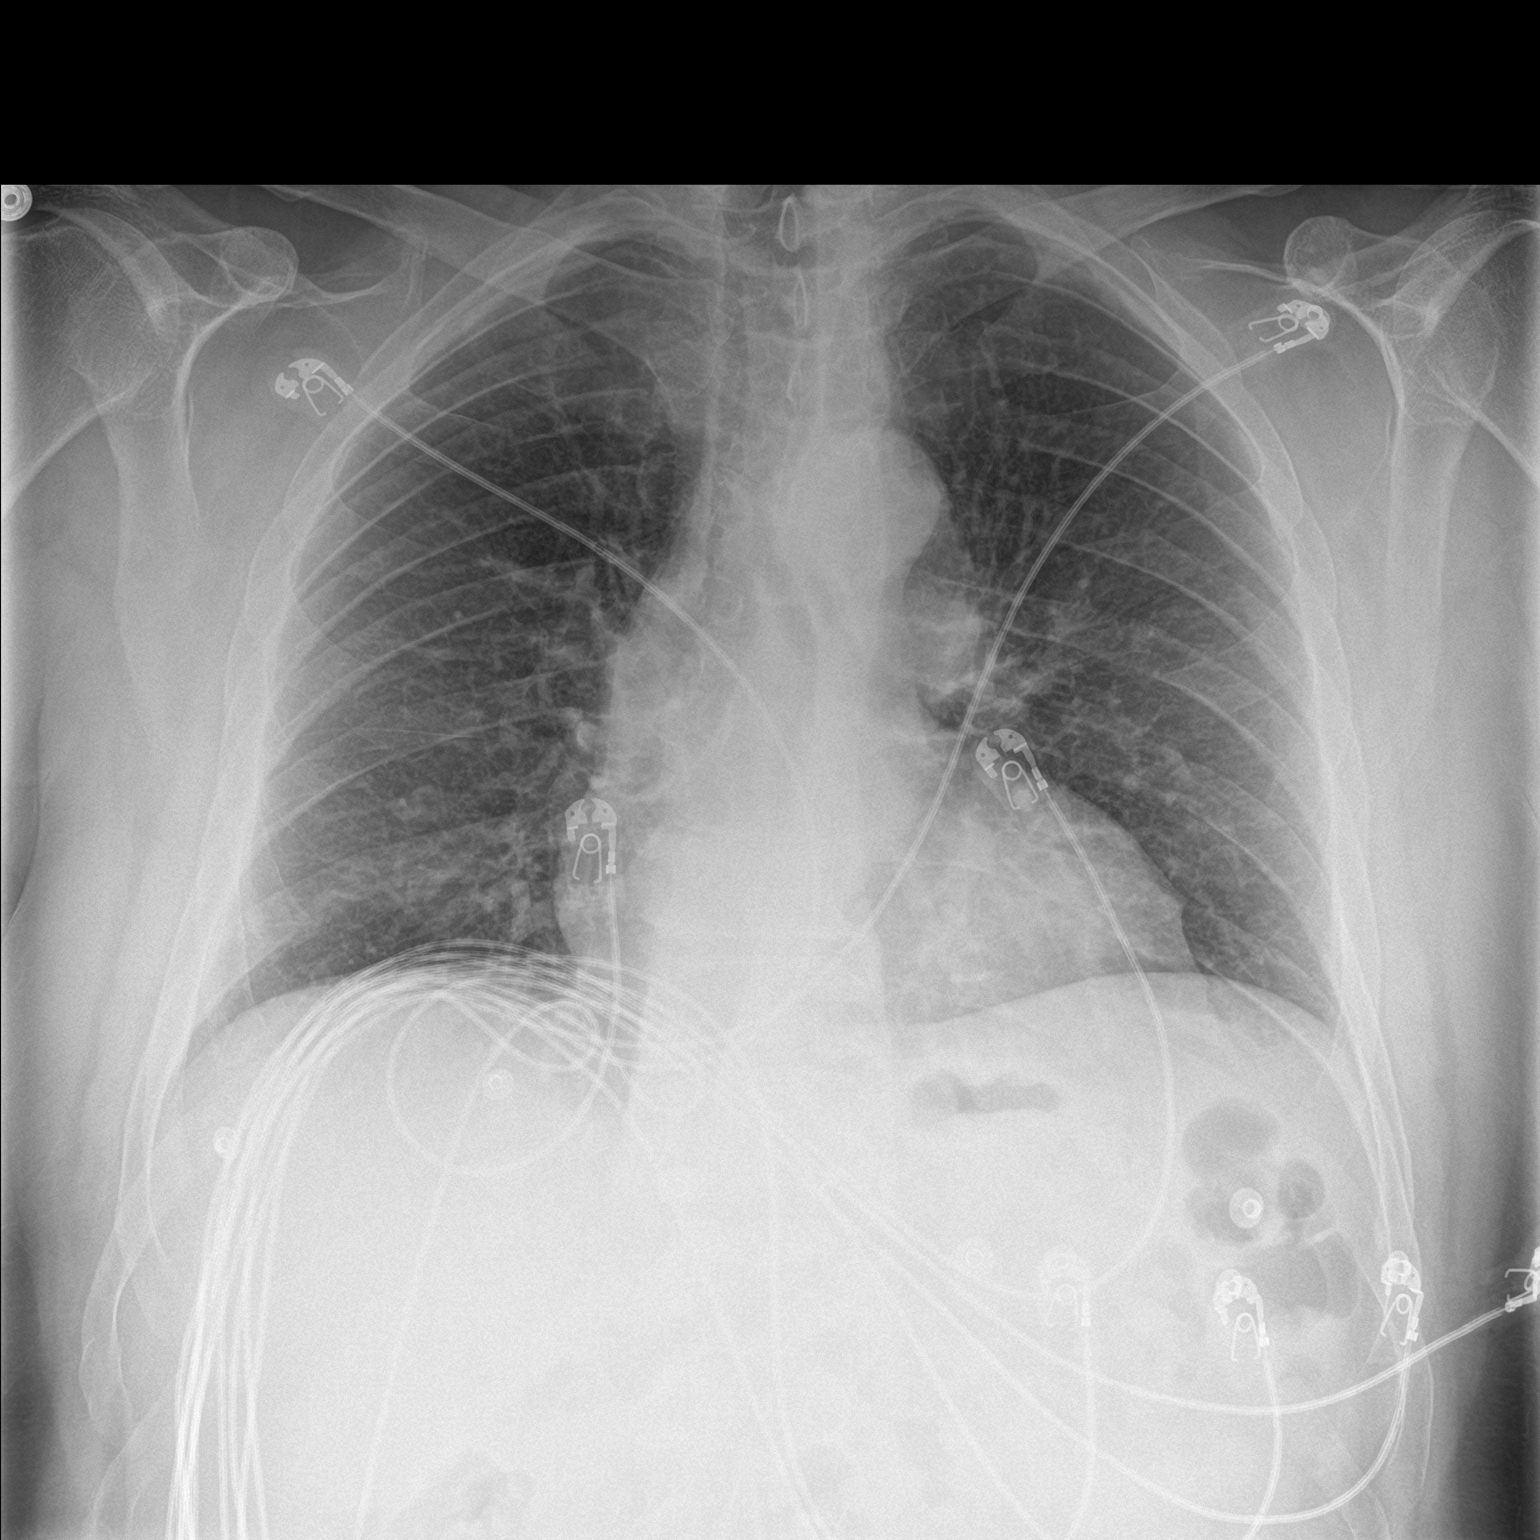

[chest lat]
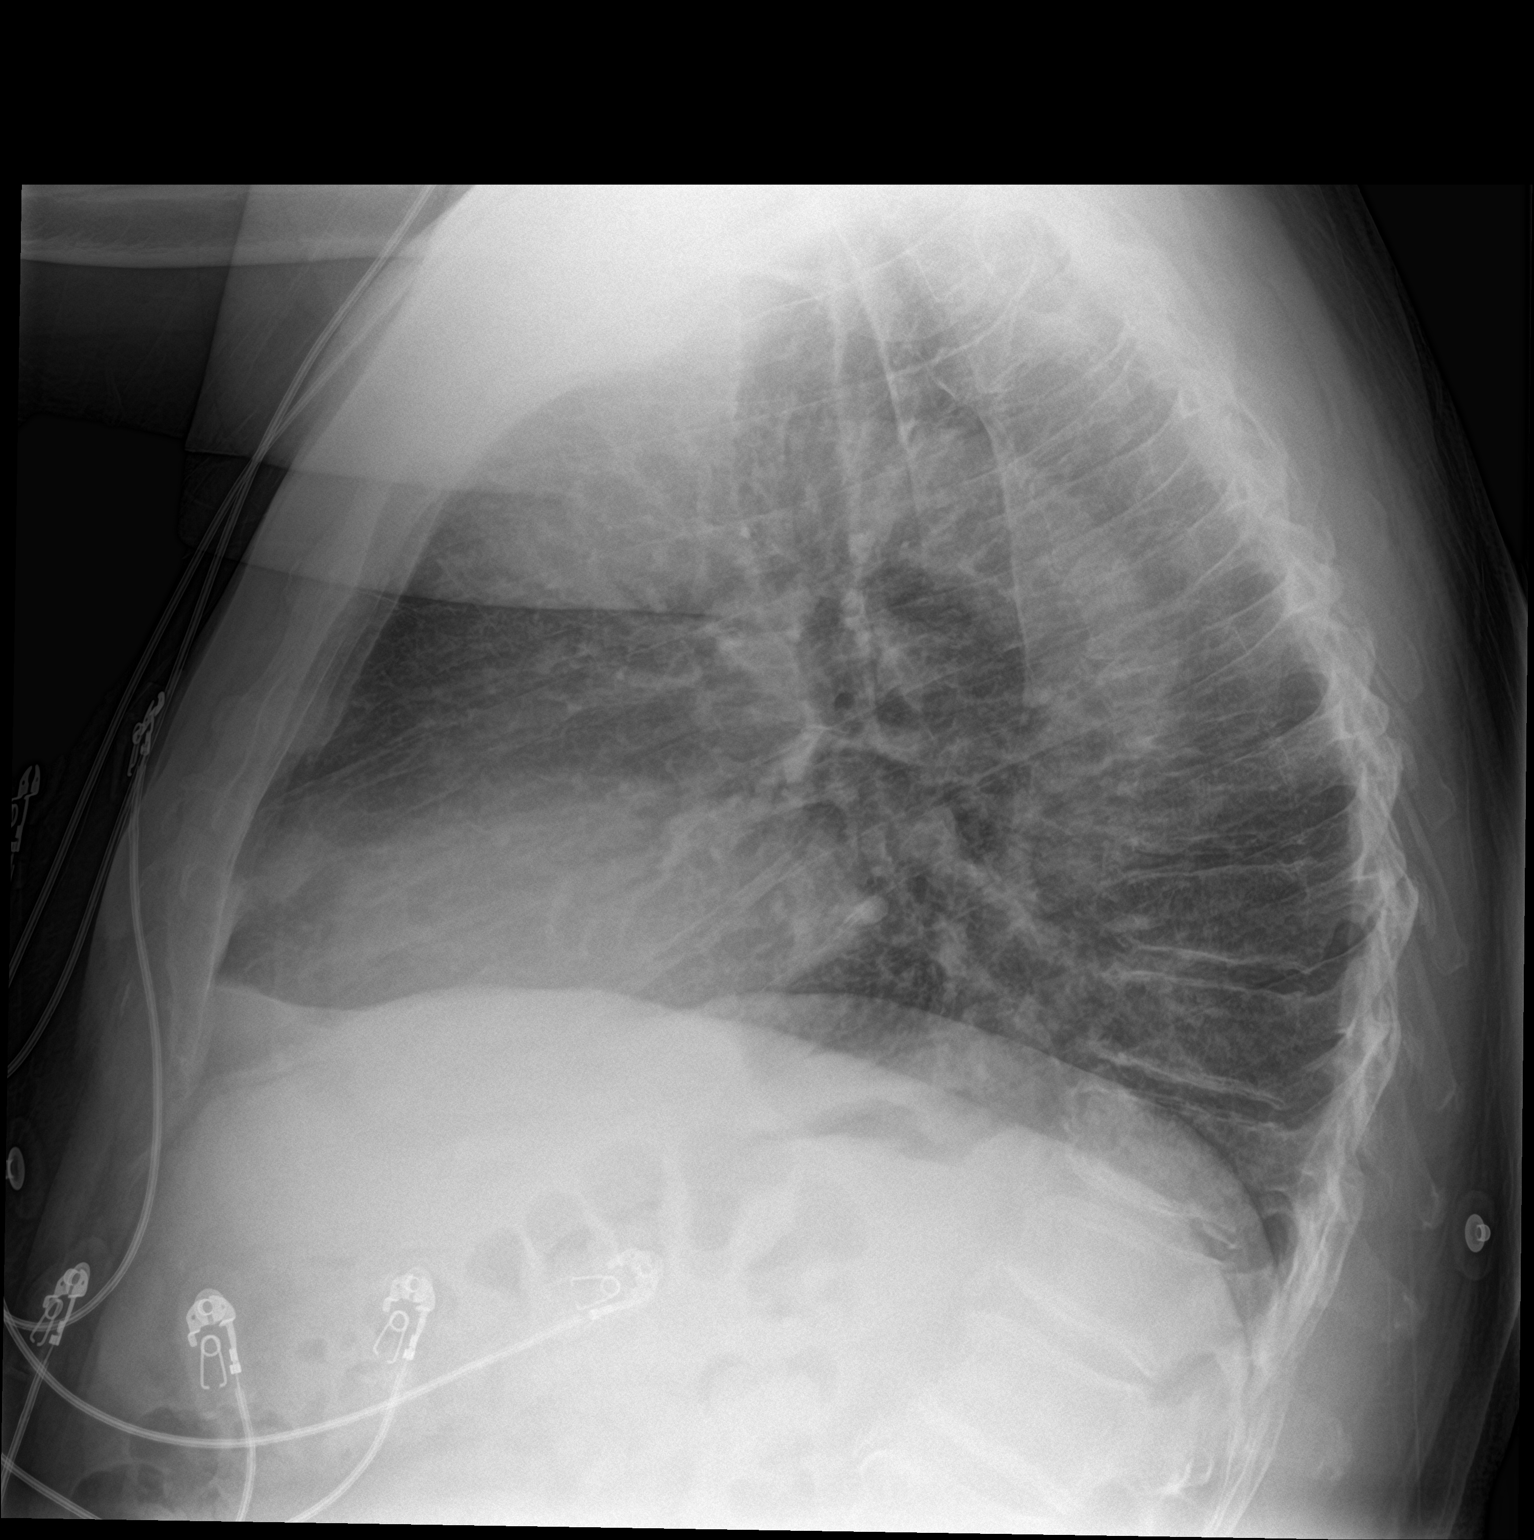

[2 of 2 positions shown; findings below may reference images not displayed]

FINDINGS: Heart is normal in size.The cardiomediastinal contours are normal.
There is peribronchial thickening. Increased AP diameter of the
thorax. Pulmonary vasculature is normal. No consolidation, pleural
effusion, or pneumothorax. Slight flattening of lower thoracic
vertebra, likely degenerative. No acute osseous abnormalities are
seen.
IMPRESSION: Peribronchial thickening suggesting may be related to smoking,
bronchitis or asthma.
